# Patient Record
Sex: Male | Born: 1975 | Race: White | Hispanic: No | Marital: Single | Smoking: Current every day smoker
Health system: Southern US, Community
[De-identification: ages and names within clinical notes are randomized; demographics above are authoritative.]

## PROBLEM LIST (undated history)

## (undated) DIAGNOSIS — I319 Disease of pericardium, unspecified: Secondary | ICD-10-CM

---

## 2005-07-11 ENCOUNTER — Emergency Department (HOSPITAL_COMMUNITY): Admission: EM | Admit: 2005-07-11 | Discharge: 2005-07-11 | Payer: Self-pay | Admitting: Emergency Medicine

## 2005-07-23 ENCOUNTER — Emergency Department (HOSPITAL_COMMUNITY): Admission: EM | Admit: 2005-07-23 | Discharge: 2005-07-23 | Payer: Self-pay | Admitting: Emergency Medicine

## 2006-01-13 ENCOUNTER — Emergency Department (HOSPITAL_COMMUNITY): Admission: EM | Admit: 2006-01-13 | Discharge: 2006-01-14 | Payer: Self-pay | Admitting: Emergency Medicine

## 2006-01-18 ENCOUNTER — Emergency Department (HOSPITAL_COMMUNITY): Admission: EM | Admit: 2006-01-18 | Discharge: 2006-01-18 | Payer: Self-pay | Admitting: Emergency Medicine

## 2006-01-27 ENCOUNTER — Inpatient Hospital Stay (HOSPITAL_COMMUNITY): Admission: EM | Admit: 2006-01-27 | Discharge: 2006-02-03 | Payer: Self-pay | Admitting: Emergency Medicine

## 2006-02-19 ENCOUNTER — Ambulatory Visit (HOSPITAL_COMMUNITY): Admission: RE | Admit: 2006-02-19 | Discharge: 2006-02-19 | Payer: Self-pay | Admitting: Anesthesiology

## 2006-02-21 ENCOUNTER — Encounter: Admission: RE | Admit: 2006-02-21 | Discharge: 2006-02-21 | Payer: Self-pay | Admitting: Anesthesiology

## 2006-03-21 ENCOUNTER — Ambulatory Visit (HOSPITAL_COMMUNITY): Admission: RE | Admit: 2006-03-21 | Discharge: 2006-03-21 | Payer: Self-pay | Admitting: Physician Assistant

## 2006-10-16 ENCOUNTER — Emergency Department (HOSPITAL_COMMUNITY): Admission: EM | Admit: 2006-10-16 | Discharge: 2006-10-16 | Payer: Self-pay | Admitting: Emergency Medicine

## 2007-09-08 ENCOUNTER — Emergency Department (HOSPITAL_COMMUNITY): Admission: EM | Admit: 2007-09-08 | Discharge: 2007-09-08 | Payer: Self-pay | Admitting: Radiation Oncology

## 2008-12-05 ENCOUNTER — Emergency Department (HOSPITAL_COMMUNITY): Admission: EM | Admit: 2008-12-05 | Discharge: 2008-12-06 | Payer: Self-pay | Admitting: Emergency Medicine

## 2008-12-15 ENCOUNTER — Emergency Department (HOSPITAL_COMMUNITY): Admission: EM | Admit: 2008-12-15 | Discharge: 2008-12-16 | Payer: Self-pay | Admitting: Emergency Medicine

## 2009-05-03 ENCOUNTER — Emergency Department (HOSPITAL_COMMUNITY): Admission: EM | Admit: 2009-05-03 | Discharge: 2009-05-03 | Payer: Self-pay | Admitting: Emergency Medicine

## 2009-06-29 ENCOUNTER — Emergency Department (HOSPITAL_COMMUNITY): Admission: EM | Admit: 2009-06-29 | Discharge: 2009-06-29 | Payer: Self-pay | Admitting: Emergency Medicine

## 2009-08-24 ENCOUNTER — Inpatient Hospital Stay (HOSPITAL_COMMUNITY): Admission: EM | Admit: 2009-08-24 | Discharge: 2009-08-29 | Payer: Self-pay | Admitting: Emergency Medicine

## 2009-08-25 ENCOUNTER — Encounter (INDEPENDENT_AMBULATORY_CARE_PROVIDER_SITE_OTHER): Payer: Self-pay | Admitting: Diagnostic Neuroimaging

## 2009-08-25 ENCOUNTER — Ambulatory Visit: Payer: Self-pay | Admitting: Surgery

## 2009-08-25 ENCOUNTER — Encounter (INDEPENDENT_AMBULATORY_CARE_PROVIDER_SITE_OTHER): Payer: Self-pay | Admitting: Internal Medicine

## 2009-08-26 ENCOUNTER — Encounter: Payer: Self-pay | Admitting: Internal Medicine

## 2009-09-15 ENCOUNTER — Ambulatory Visit: Payer: Self-pay | Admitting: Internal Medicine

## 2009-09-15 DIAGNOSIS — M545 Low back pain: Secondary | ICD-10-CM

## 2009-09-15 DIAGNOSIS — G4700 Insomnia, unspecified: Secondary | ICD-10-CM

## 2009-09-15 DIAGNOSIS — F341 Dysthymic disorder: Secondary | ICD-10-CM

## 2009-09-15 DIAGNOSIS — M25559 Pain in unspecified hip: Secondary | ICD-10-CM

## 2009-09-20 ENCOUNTER — Telehealth (INDEPENDENT_AMBULATORY_CARE_PROVIDER_SITE_OTHER): Payer: Self-pay | Admitting: *Deleted

## 2009-09-27 ENCOUNTER — Encounter (INDEPENDENT_AMBULATORY_CARE_PROVIDER_SITE_OTHER): Payer: Self-pay | Admitting: Internal Medicine

## 2009-09-29 ENCOUNTER — Encounter (INDEPENDENT_AMBULATORY_CARE_PROVIDER_SITE_OTHER): Payer: Self-pay | Admitting: Internal Medicine

## 2009-10-03 ENCOUNTER — Encounter (INDEPENDENT_AMBULATORY_CARE_PROVIDER_SITE_OTHER): Payer: Self-pay | Admitting: Internal Medicine

## 2009-10-20 ENCOUNTER — Telehealth (INDEPENDENT_AMBULATORY_CARE_PROVIDER_SITE_OTHER): Payer: Self-pay | Admitting: Internal Medicine

## 2009-10-20 ENCOUNTER — Encounter (INDEPENDENT_AMBULATORY_CARE_PROVIDER_SITE_OTHER): Payer: Self-pay | Admitting: Internal Medicine

## 2009-12-01 ENCOUNTER — Emergency Department (HOSPITAL_COMMUNITY): Admission: EM | Admit: 2009-12-01 | Discharge: 2009-12-01 | Payer: Self-pay | Admitting: Emergency Medicine

## 2009-12-13 ENCOUNTER — Telehealth (INDEPENDENT_AMBULATORY_CARE_PROVIDER_SITE_OTHER): Payer: Self-pay | Admitting: Internal Medicine

## 2009-12-13 DIAGNOSIS — R269 Unspecified abnormalities of gait and mobility: Secondary | ICD-10-CM

## 2009-12-14 ENCOUNTER — Encounter (INDEPENDENT_AMBULATORY_CARE_PROVIDER_SITE_OTHER): Payer: Self-pay | Admitting: *Deleted

## 2009-12-15 ENCOUNTER — Emergency Department (HOSPITAL_COMMUNITY)
Admission: EM | Admit: 2009-12-15 | Discharge: 2009-12-15 | Payer: Self-pay | Source: Home / Self Care | Admitting: Emergency Medicine

## 2009-12-21 ENCOUNTER — Encounter (INDEPENDENT_AMBULATORY_CARE_PROVIDER_SITE_OTHER): Payer: Self-pay | Admitting: *Deleted

## 2009-12-22 ENCOUNTER — Emergency Department (HOSPITAL_COMMUNITY): Admission: EM | Admit: 2009-12-22 | Discharge: 2009-09-15 | Payer: Self-pay | Admitting: Emergency Medicine

## 2009-12-27 ENCOUNTER — Encounter: Admission: RE | Admit: 2009-12-27 | Payer: Self-pay | Source: Home / Self Care | Admitting: Internal Medicine

## 2009-12-27 ENCOUNTER — Telehealth (INDEPENDENT_AMBULATORY_CARE_PROVIDER_SITE_OTHER): Payer: Self-pay | Admitting: *Deleted

## 2010-01-24 ENCOUNTER — Ambulatory Visit: Admit: 2010-01-24 | Payer: Self-pay | Admitting: Internal Medicine

## 2010-02-14 NOTE — Letter (Signed)
Summary: *Referral Letter  [facility]  [address] [place], [place] [address]   Phone: [phone]  Fax: [phone]    10/20/2009  Thank you in advance for agreeing to see my patient:  Kurt Mcdaniel [address] [place], [place]  [address]  Phone: [phone]  Reason for Referral: 35 yo male who has just established with our clinic hospitalized beginning of August for right sided weakness and right neck and facial numbness.   Non contrast and contrast MRI show white matter disease advanced for age.  CSF labs after discharge returned with positive oligoclonal bands, ESR, RPR, HIV, CSF viral cultures, Lymes testing all negative. Positive for lupus anticoagulant with no personal or family hx of clotting problems, DVT, recurrent miscarriage.  Low protein C as well.  TSH was somewhat low at 0.236 and A1C high normal at 5.9%.  Pt. placed on prednisone burst and taper with improvement.  Did follow up with Neurology locally, a Dr. Marjory Lies, but is unable to afford payments for his office as is self pay.  Reportedly told to continue a baby aspirin and finish prednisone taper at his outpatient follow up--await those records.  Procedures Requested: Evaluation and treatment for possible MS.  Will send labs and radiologic studies from his hospitalization along with this note.  Current Medical Problems: 1)  ? of MULTIPLE SCLEROSIS (ICD-340) 2)  INSOMNIA, CHRONIC (ICD-307.42) 3)  HIP PAIN, RIGHT, CHRONIC (ICD-719.45) 4)  CHRONIC FOOT PAIN, LEFT (ICD-729.5) 5)  LOW BACK PAIN, CHRONIC (ICD-724.2) 6)  ANXIETY DEPRESSION (ICD-300.4)   Current Medications: 1)  PREDNISONE (PAK) 5 MG TABS (PREDNISONE) UAD 2)  ZOLOFT 100 MG TABS (SERTRALINE HCL) 1 1/2 tabs by mouth daily--Dr[facility] 3)  AMITRIPTYLINE HCL 25 MG TABS (AMITRIPTYLINE HCL) 1 tab by mouth at bedtime   Past Medical History:  Prior History of Blood Transfusions:   Pertinent  Labs:    Thank you again for agreeing to see our patient; please contact us if you have any further questions or need additional information.  Sincerely,  Julieanne Manson MD

## 2010-02-14 NOTE — Letter (Signed)
Summary: *HSN Results Follow up  [facility]  [address] [place], [place] [address]   Phone: [phone]  Fax: [phone]      12/21/2009   Kurt Mcdaniel [address] [place], [place]  [address]   Dear  Mr. Kurt Mcdaniel,                            ____S.Drinkard,FNP   ____D. Gore,FNP       ____B. McPherson,MD   ____V. Rankins,MD    __X__E. Mulberry,MD    ____N. Daphine Deutscher, FNP  ____D. Reche Dixon, MD    ____K. Philipp Deputy, MD    ____Other     This letter is to inform you that your recent test(s):  _______Pap Smear    _______Lab Test     _______X-ray    _______ is within acceptable limits  _______ requires a medication change  _______ requires a follow-up lab visit  _______ requires a follow-up visit with your provider   Comments: I BEEN TRYING TO REACH YOU BUT PHONE [phone] ABOUT YOUR APPT  [facility]  . YOU HAVE AN APPT 12-27-09 @ 1:15 PM  ADDRESS [address] PH # [phone] .       _________________________________________________________ If you have any questions, please contact our office                     Sincerely,  Cheryll Dessert [facility]

## 2010-02-14 NOTE — Letter (Signed)
Summary: *HSN Results Follow up  [facility]  [address] [place], [place] [address]   Phone: [phone]  Fax: [phone]      12/14/2009   Kurt Mcdaniel [address] [place], [place]  [address]   Dear  Kurt Mcdaniel,                            ____S.Drinkard,FNP   ____D. Gore,FNP       ____B. McPherson,MD   ____V. Rankins,MD    __X__E. Mulberry,MD    ____N. Daphine Deutscher, FNP  ____D. Reche Dixon, MD    ____K. Philipp Deputy, MD    ____Other     This letter is to inform you that your recent test(s):  _______Pap Smear    _______Lab Test     _______X-ray    _______ is within acceptable limits  _______ requires a medication change  _______ requires a follow-up lab visit  _______ requires a follow-up visit with your [name]   Comments: We have been trying to reach you by phone is about your referral to the Neurologist . Please, call [phone] to schedule an appt  they don't  give it to me . After you have the appt can you please,call me and letme know Thank you .       _________________________________________________________ If you have any questions, please contact our office                     Sincerely,  Cheryll Dessert [facility]

## 2010-02-14 NOTE — Letter (Signed)
Summary: REQUESTING RECORDS FROM [facility]  REQUESTING RECORDS FROM [facility]   Imported By: Arta Bruce 09/28/2009 14:34:37  _____________________________________________________________________  External Attachment:    Type:   Image     Comment:   External Document

## 2010-02-14 NOTE — Letter (Signed)
Summary: REQUESTING RECORDS FROM [facility]  REQUESTING RECORDS FROM [facility]   Imported By: Arta Bruce 09/28/2009 14:23:25  _____________________________________________________________________  External Attachment:    Type:   Image     Comment:   External Document

## 2010-02-14 NOTE — Letter (Signed)
Summary: PT INFORMATION SHEET  PT INFORMATION SHEET   Imported By: Arta Bruce 09/16/2009 14:48:39  _____________________________________________________________________  External Attachment:    Type:   Image     Comment:   External Document
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
## 2010-02-14 NOTE — Progress Notes (Signed)
Summary: Neurology referral  Phone Note Outgoing Call   Summary of Call: NOra--needs referral to [facility] neuro for ?MS. Will need copies of all labs and radiologic testing --especially the 2 MRIs  done in hospital in August/ H and P, Discharge summary.  If you need me to copy those off and bring to you, let me know.  He will need to go to hospital and get a disc with the radiology studies on it to take to his appt. Initial call taken by: Julieanne Manson MD,  October 20, 2009 12:42 PM  Follow-up for Phone Call        Dr Delrae Alfred I went to [facility] but don't want to print . Can you printing for me please Thank you   Follow-up by: Cheryll Dessert,  October 28, 2009 5:10 PM  Additional Follow-up for Phone Call Additional follow up Details #1::        I printed already.I send the Referral to [facility]  Neurosurgeon waiting for an appt  Additional Follow-up by: Cheryll Dessert,  November 03, 2009 12:55 PM
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
## 2010-02-14 NOTE — Letter (Signed)
Summary: REQUESTING RECORDS FROM  [facility]  [facility]  REQUESTING RECORDS FROM  [facility]  [facility]   Imported By: Arta Bruce 09/28/2009 14:27:16  _____________________________________________________________________  External Attachment:    Type:   Image     Comment:   External Document

## 2010-02-16 NOTE — Progress Notes (Signed)
Summary: OCCUPATIONAL ORDER NEEDS TO BE CHANGED  Phone Note From Other Clinic   Caller: ANGIE @ [facility] Reason for Call: Need Referral Information Summary of Call: MULBERRY PT. ANGIE FROM [facility]. CALLED AND SAYS THAT Leng IS COMING IN AFTER LUNCH TODAY FOR THERAPY AND HIS ORDER NEEDS TO BE CHANGED FROM OCCUPATIONAL THERAPY TO PHYSICAL THERAPY. THE FAX NUMBER IS [phone] Initial call taken by: Leodis Rains,  December 27, 2009 10:33 AM  Follow-up for Phone Call        Will foward to Dr. Delrae Alfred.  Follow-up by: Hale Drone CMA,  December 27, 2009 2:58 PM  Additional Follow-up for Phone Call Additional follow up Details #1::        The order is for PT, not OT--do they actually want OT? Additional Follow-up by: Julieanne Manson MD,  December 28, 2009 11:25 AM    Additional Follow-up for Phone Call Additional follow up Details #2::    Called [facility]... They indeed have an order that read OT instead of PT. Dr. Delrae Alfred signed the new order that reads PT. Also, pt. missed appt. Called and LM on [phone] pt. to call us back. Need to r/s appt. Will foward to Arna Medici to f/u on. ..... Hale Drone CMA  December 30, 2009 11:42 AM

## 2010-03-11 ENCOUNTER — Encounter (INDEPENDENT_AMBULATORY_CARE_PROVIDER_SITE_OTHER): Payer: Self-pay | Admitting: Internal Medicine

## 2010-03-14 NOTE — Miscellaneous (Signed)
  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 Marjory Lies felt presence of lupus anticoagulant, sudden onset of symptoms and lack of contrast enhancement suggest antiphospholipid antibody syndrome rather than  MS

## 2010-03-28 NOTE — Letter (Signed)
Summary: RECEIVED RECORDS FROM [facility]  RECEIVED RECORDS FROM [facility]   Imported By: Arta Bruce 03/21/2010 09:33:31  _____________________________________________________________________  External Attachment:    Type:   Image     Comment:   External Document

## 2010-03-28 NOTE — Letter (Signed)
Summary: [facility]   [facility]   Imported By: Arta Bruce 03/21/2010 09:36:19  _____________________________________________________________________  External Attachment:    Type:   Image     Comment:   External Document

## 2010-03-31 LAB — CBC
HCT: 35.9 % — ABNORMAL LOW (ref 39.0–52.0)
HCT: 36 % — ABNORMAL LOW (ref 39.0–52.0)
HCT: 37.4 % — ABNORMAL LOW (ref 39.0–52.0)
Hemoglobin: 12.2 g/dL — ABNORMAL LOW (ref 13.0–17.0)
Hemoglobin: 12.2 g/dL — ABNORMAL LOW (ref 13.0–17.0)
MCH: 31.4 pg (ref 26.0–34.0)
MCHC: 33.9 g/dL (ref 30.0–36.0)
MCHC: 34.1 g/dL (ref 30.0–36.0)
MCV: 93.6 fL (ref 78.0–100.0)
Platelets: 314 10*3/uL (ref 150–400)
RBC: 3.86 MIL/uL — ABNORMAL LOW (ref 4.22–5.81)
RBC: 3.99 MIL/uL — ABNORMAL LOW (ref 4.22–5.81)
RDW: 12.9 % (ref 11.5–15.5)
RDW: 12.9 % (ref 11.5–15.5)

## 2010-03-31 LAB — CSF CELL COUNT WITH DIFFERENTIAL
Tube #: 1
Tube #: 4
WBC, CSF: 6 /mm3 — ABNORMAL HIGH (ref 0–5)

## 2010-03-31 LAB — COMPREHENSIVE METABOLIC PANEL
ALT: 31 U/L (ref 0–53)
AST: 64 U/L — ABNORMAL HIGH (ref 0–37)
Albumin: 3.1 g/dL — ABNORMAL LOW (ref 3.5–5.2)
Alkaline Phosphatase: 71 U/L (ref 39–117)
BUN: 18 mg/dL (ref 6–23)
CO2: 23 mEq/L (ref 19–32)
Calcium: 8 mg/dL — ABNORMAL LOW (ref 8.4–10.5)
Calcium: 8.2 mg/dL — ABNORMAL LOW (ref 8.4–10.5)
Chloride: 105 mEq/L (ref 96–112)
Chloride: 105 mEq/L (ref 96–112)
GFR calc Af Amer: >60 mL/min (ref >60)
GFR calc non Af Amer: >60 mL/min (ref >60)
GFR calc non Af Amer: >60 mL/min (ref >60)
Glucose, Bld: 245 mg/dL — ABNORMAL HIGH (ref 70–99)
Glucose, Bld: 92 mg/dL (ref 70–99)
Potassium: 3.7 mEq/L (ref 3.5–5.1)
Potassium: 4.4 mEq/L (ref 3.5–5.1)
Sodium: 136 mEq/L (ref 135–145)
Total Bilirubin: 0.2 mg/dL — ABNORMAL LOW (ref 0.3–1.2)
Total Bilirubin: 0.5 mg/dL (ref 0.3–1.2)
Total Protein: 5.6 g/dL — ABNORMAL LOW (ref 6.0–8.3)

## 2010-03-31 LAB — URINALYSIS, ROUTINE W REFLEX MICROSCOPIC
Glucose, UA: NEGATIVE mg/dL
Nitrite: NEGATIVE

## 2010-03-31 LAB — POCT I-STAT, CHEM 8
Chloride: 95 mEq/L — ABNORMAL LOW (ref 96–112)
Glucose, Bld: 92 mg/dL (ref 70–99)
Hemoglobin: 14.6 g/dL (ref 13.0–17.0)
Potassium: 4.2 mEq/L (ref 3.5–5.1)
Sodium: 129 mEq/L — ABNORMAL LOW (ref 135–145)

## 2010-03-31 LAB — BASIC METABOLIC PANEL
BUN: 15 mg/dL (ref 6–23)
CO2: 29 mEq/L (ref 19–32)
Glucose, Bld: 137 mg/dL — ABNORMAL HIGH (ref 70–99)
Potassium: 3.8 mEq/L (ref 3.5–5.1)
Sodium: 138 mEq/L (ref 135–145)

## 2010-03-31 LAB — SEDIMENTATION RATE: Sed Rate: 15 mm/hr (ref 0–16)

## 2010-03-31 LAB — FOLATE RBC: RBC Folate: 702 ng/mL — ABNORMAL HIGH (ref 180–600)

## 2010-03-31 LAB — PROTIME-INR
INR: 1.44 (ref 0.00–1.49)
Prothrombin Time: 17.7 seconds — ABNORMAL HIGH (ref 11.6–15.2)

## 2010-03-31 LAB — ANTITHROMBIN III: AntiThromb III Func: 94 % (ref 76–126)

## 2010-03-31 LAB — FUNGUS CULTURE W SMEAR

## 2010-03-31 LAB — CARDIOLIPIN ANTIBODIES, IGG, IGM, IGA: Anticardiolipin IgM: 9 MPL U/mL — ABNORMAL LOW (ref <11)

## 2010-03-31 LAB — GLUCOSE, CAPILLARY
Glucose-Capillary: 137 mg/dL — ABNORMAL HIGH (ref 70–99)
Glucose-Capillary: 141 mg/dL — ABNORMAL HIGH (ref 70–99)
Glucose-Capillary: 174 mg/dL — ABNORMAL HIGH (ref 70–99)
Glucose-Capillary: 97 mg/dL (ref 70–99)

## 2010-03-31 LAB — PROTEIN S ACTIVITY: Protein S Activity: 78 % (ref 69–129)

## 2010-03-31 LAB — HOMOCYSTEINE: Homocysteine: 5.8 umol/L (ref 4.0–15.4)

## 2010-03-31 LAB — MAGNESIUM: Magnesium: 2 mg/dL (ref 1.5–2.5)

## 2010-03-31 LAB — LUPUS ANTICOAGULANT PANEL
DRVVT: 47.7 secs — ABNORMAL HIGH (ref 36.2–44.3)
Lupus Anticoagulant: DETECTED — AB
PTTLA 4:1 Mix: 50.2 secs — ABNORMAL HIGH (ref 30.0–45.6)
dRVVT Incubated 1:1 Mix: 42.7 secs (ref 36.2–44.3)

## 2010-03-31 LAB — CSF CULTURE W GRAM STAIN

## 2010-03-31 LAB — APTT: aPTT: 31 seconds (ref 24–37)

## 2010-03-31 LAB — VIRUS CULTURE: Preliminary Culture: NEGATIVE

## 2010-03-31 LAB — RAPID URINE DRUG SCREEN, HOSP PERFORMED
Amphetamines: NOT DETECTED
Barbiturates: NOT DETECTED

## 2010-03-31 LAB — PROTEIN AND GLUCOSE, CSF
Glucose, CSF: 88 mg/dL — ABNORMAL HIGH (ref 43–76)
Total  Protein, CSF: 50 mg/dL — ABNORMAL HIGH (ref 15–45)

## 2010-03-31 LAB — RPR: RPR Ser Ql: NONREACTIVE

## 2010-03-31 LAB — HEMOGLOBIN A1C: Mean Plasma Glucose: 123 mg/dL — ABNORMAL HIGH (ref <117)

## 2010-03-31 LAB — ANGIOTENSIN CONVERTING ENZYME, CSF: Angio Convert Enzyme: <3 U/L (ref <16)

## 2010-03-31 LAB — BETA-2-GLYCOPROTEIN I ABS, IGG/M/A
Beta-2-Glycoprotein I IgA: 2 A Units (ref <20)
Beta-2-Glycoprotein I IgM: 7 M Units (ref <20)

## 2010-03-31 LAB — ETHANOL: Alcohol, Ethyl (B): <5 mg/dL (ref 0–10)

## 2010-03-31 LAB — TSH: TSH: 0.236 u[IU]/mL — ABNORMAL LOW (ref 0.350–4.500)

## 2010-04-02 LAB — DIFFERENTIAL
Basophils Absolute: 0 10*3/uL (ref 0.0–0.1)
Eosinophils Absolute: 0.1 10*3/uL (ref 0.0–0.7)
Eosinophils Relative: 1 % (ref 0–5)
Lymphocytes Relative: 23 % (ref 12–46)

## 2010-04-02 LAB — CBC
HCT: 44.2 % (ref 39.0–52.0)
Platelets: 200 10*3/uL (ref 150–400)
RDW: 12.7 % (ref 11.5–15.5)

## 2010-04-02 LAB — POCT I-STAT, CHEM 8
Calcium, Ion: 1.08 mmol/L — ABNORMAL LOW (ref 1.12–1.32)
Chloride: 104 mEq/L (ref 96–112)
HCT: 48 % (ref 39.0–52.0)
Hemoglobin: 16.3 g/dL (ref 13.0–17.0)
TCO2: 28 mmol/L (ref 0–100)

## 2010-04-02 LAB — LIPASE, BLOOD: Lipase: 23 U/L (ref 11–59)

## 2010-04-04 LAB — URINALYSIS, ROUTINE W REFLEX MICROSCOPIC
Bilirubin Urine: NEGATIVE
Nitrite: NEGATIVE
Specific Gravity, Urine: 1.019 (ref 1.005–1.030)
pH: 5.5 (ref 5.0–8.0)

## 2010-04-04 LAB — HEMOCCULT GUIAC POC 1CARD (OFFICE): Fecal Occult Bld: NEGATIVE

## 2010-04-04 LAB — COMPREHENSIVE METABOLIC PANEL
AST: 15 U/L (ref 0–37)
Albumin: 4.1 g/dL (ref 3.5–5.2)
Alkaline Phosphatase: 44 U/L (ref 39–117)
Chloride: 106 mEq/L (ref 96–112)
GFR calc Af Amer: >60 mL/min (ref >60)
Potassium: 4 mEq/L (ref 3.5–5.1)
Total Bilirubin: 0.4 mg/dL (ref 0.3–1.2)

## 2010-04-04 LAB — DIFFERENTIAL
Basophils Absolute: 0 10*3/uL (ref 0.0–0.1)
Basophils Relative: 0 % (ref 0–1)
Eosinophils Relative: 1 % (ref 0–5)
Lymphocytes Relative: 31 % (ref 12–46)
Monocytes Absolute: 0.8 10*3/uL (ref 0.1–1.0)
Monocytes Relative: 8 % (ref 3–12)

## 2010-04-04 LAB — CBC
Platelets: 233 10*3/uL (ref 150–400)
WBC: 10 10*3/uL (ref 4.0–10.5)

## 2010-04-04 LAB — URINE CULTURE: Colony Count: NO GROWTH

## 2010-04-04 LAB — APTT: aPTT: 34 seconds (ref 24–37)
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
## 2010-06-02 NOTE — H&P (Signed)
NAME:  Kurt Mcdaniel, Kurt Mcdaniel             ACCOUNT NO.:  [id number]   MEDICAL RECORD NO.:  [id number]          PATIENT TYPE:  INP   LOCATION:  5024                         FACILITY:  [facility]   PHYSICIAN:  Hillery Aldo, M.D.   DATE OF BIRTH:  [birth year]   DATE OF ADMISSION:  01/27/2006  DATE OF DISCHARGE:                              HISTORY & PHYSICAL   PRIMARY CARE PHYSICIAN:  Production assistant, radio.   CHIEF COMPLAINT:  Back pain.   HISTORY OF PRESENT ILLNESS:  The patient is a 35 year old male with a  past medical history of herniated disk related to a work injury that he  suffered back in the fall.  The patient claims that while in the shower  today, he became faint and slipped, striking his back and buttocks and  now has intractable lower back pain radiating down the right leg.  The  patient has had intermittent back pain secondary to his work injury  prior to this exacerbation.  The patient has received multiple doses of  Dilaudid and is somewhat sedated, and, therefore, the history is  somewhat unreliable.  He tells differing stories about whether or not he  was faint prior to the fall.  Because of ongoing pain, he insisted on  being admitted to the hospital, and, therefore, we were consulted for  admission services.  Preliminary radiographs are negative.  He is being  admitted for observation and pain control.   PAST MEDICAL HISTORY:  None except for herniated disk secondary to work  injury.   FAMILY HISTORY:  Reviewed and is noncontributory.   SOCIAL HISTORY:  The patient is single and lives with his girlfriend.  He smokes one-half to one pack of tobacco daily.  He denies alcohol or  drug use.  He works as a Production designer, theatre/television/film for [employer].   ALLERGIES:  None.   MEDICATIONS:  None.   REVIEW OF SYSTEMS:  The patient denies fever.  He reports occasional  chills.  He states that his appetite is normal except when he is having  back pain and then it is decreased.  Denies  chest pain.  He is  occasionally short of breath.  Denies cough.  He does report pain with  bowel movements but no melena or hematochezia.  Denies dysuria.  Back  pain as described in HPI.   PHYSICAL EXAMINATION:  VITAL SIGNS:  Temperature 96.8, pulse 78,  respirations 16, blood pressure 140/79.  GENERAL:  This is a sedated male who is somewhat garrulous and  argumentative, wanting to leave the emergency department area to go  smoke a cigarette.  He keeps attempting to get out of bed despite being  asked not to do so.  HEENT:  Normocephalic, atraumatic.  Pupils are equal, round and reactive  to light.  Extraocular movements intact.  Oropharynx is clear.  NECK:  Supple, no thyromegaly, no lymphadenopathy, no jugular venous  distension.  CHEST:  Lungs clear to auscultation bilaterally with good air movement.  HEART:  Regular rate and rhythm.  No murmurs, rubs or gallops.  ABDOMEN:  Soft, nontender, nondistended with normoactive  bowel sounds.  EXTREMITIES:  No clubbing, edema or cyanosis.  SKIN:  Warm and dry.  No rashes.  NEUROLOGIC:  The patient is alert and oriented x3.  Cranial nerves II-  XII are grossly intact.  He has diminished strength to his lower  extremities bilaterally which is symmetric.  He has positive leg raises.  His pain seems out of proportion to physical exam findings.   ASSESSMENT/PLAN:  1. Intractable back pain.  The patient likely has some underlying      degenerative disk disease and herniated disks in the lower spine      accounting for his back pain.  Because of inability to control his      pain with IV narcotics, we are admitting him to evaluate his lower      back with an MRI scan.  He will be put on PCA Dilaudid at full      strength and Toradol for pain control.  If his MRI does show      significant cord compression, will consult neurosurgery and put the      patient on steroids.  Will go ahead and put him on stool softeners,      given the fact that he  will be on narcotic-based pain medicine to      help prevent constipation.  2. Tobacco abuse.  Will initiate tobacco cessation counseling and      prescribe a nicotine patch.  3. Prophylaxis.  Will initiate GI prophylaxis with Protonix and deep      vein thrombosis prophylaxis with Lovenox.      Hillery Aldo, M.D.  Electronically Signed     CR/MEDQ  D:  01/27/2006  T:  01/28/2006  Job:  [id number]

## 2010-06-02 NOTE — Discharge Summary (Signed)
Kurt Mcdaniel, Kurt Mcdaniel             ACCOUNT NO.:  [id number]   MEDICAL RECORD NO.:  [id number]          PATIENT TYPE:  INP   LOCATION:  5024                         FACILITY:  [facility]   PHYSICIAN:  Lonia Blood, M.D.      DATE OF BIRTH:  Mar 21, 1975   DATE OF ADMISSION:  01/27/2006  DATE OF DISCHARGE:  02/03/2006                               DISCHARGE SUMMARY   PRIMARY CARE PHYSICIAN:  [facility].   DISCHARGE DIAGNOSES:  1. Intractable back pain with some mild degenerative disk disease,      nonsurgical per neurosurgeon and orthopedic surgeon.  2. Right upper lobe pneumonia.  3. Tobacco abuse.   DISCHARGE MEDICATIONS:  1. OxyContin 30 mg p.o. b.i.d.  2. Oxycodone immediate release 10 mg q.4h. p.r.n..  3. Avelox 400 mg daily for 5 days.  4. Xanax 1 mg 3x a day for anxiety.  5. Flexeril 10 mg q.6h.  6. Lyrica 75 mg b.i.d.  7. Colace 100 mg b.i.d.   DISPOSITION:  Patient will follow up with Dr. Vear Clock at pain clinic.  He has intractable back pain, non-surgical in nature.  He will also  follow up with [facility], at which point they will  clear him to go to work as necessary.   PROCEDURES PERFORMED:  X-ray of the C spine on January 13 showed no  acute findings.  Plain x-ray of the L spine also showed no acute  findings, mild left wall scoliosis.  MRI of the L spine on January 13  showed diffuse annular bulge and annular rent L4-5 but no significant  neural compression, mild foraminal encroachment bilaterally.  Shallow  central disk protrusion at L5-S1.  Chest x-ray on January 18 shows right  upper lobe pneumonia, mild chronic bronchitic changes.   CONSULTATIONS:  1. Hilda Lias, Neurosurgery.  2. Dr. Shelle Iron, Orthopedic Surgery.   BRIEF HISTORY AND PHYSICAL:  Please refer to dictated History and  Physical by Dr. Hillery Aldo.  In short, however, this is a 35-year-  old gentleman who has a history of work-related injury after he fell.  He  has some mild disk protrusion and has been out of work apparently  since October.  He came in with severe back pain that is intractable and  not being managed by his home medications.  He was subsequently admitted  for further management.   HOSPITAL COURSE:  1. Intractable back pain.  Patient was admitted, had an MRI done that      showed mild findings as above.  Neurosurgery was consulted, Dr.      Jeral Fruit reviewed the MRI and informed patient that it is not      something that requires immediate surgery.  Patient was, however,      adamant.  I have discussed also with Dr. Shelle Iron who apparently had      an outpatient MRI and told the patient the same thing.  Patient was      sent to have an epidural steroid injection.  Patient was adamant.      He was on PCA pump for a while, still  not getting control of his      pain.  With gradually transition him to orals, patient still      complains of pain.  As such, we determined that the best option is      to send you to a pain clinic center where they can try various      options to control his pain.  2. Tobacco use.  Patient was started on nicotine patch in the      hospital.  3. Pneumonia.  On January 31, 2006, patient spiked a fever of 102.  He      has been bed-bound for a long time and has been on large doses of      narcotics.  This may have precipitated his current pneumonia.  With      that, we have started him on Avelox and will continue that for      another 5 more days.  Otherwise, the patient is stable and will      follow up with Dr. Vear Clock in the pain clinic, and the telephone      number has been given to the patient.      Lonia Blood, M.D.  Electronically Signed     LG/MEDQ  D:  02/03/2006  T:  02/03/2006  Job:  [id number]

## 2012-04-08 ENCOUNTER — Emergency Department (HOSPITAL_COMMUNITY)
Admission: EM | Admit: 2012-04-08 | Discharge: 2012-04-08 | Disposition: A | Discharge status: Home / Self Care | Payer: Self-pay | Attending: Emergency Medicine | Admitting: Emergency Medicine

## 2012-04-08 ENCOUNTER — Encounter (HOSPITAL_COMMUNITY): Payer: Self-pay | Admitting: Emergency Medicine

## 2012-04-08 ENCOUNTER — Emergency Department (HOSPITAL_COMMUNITY): Payer: Self-pay

## 2012-04-08 DIAGNOSIS — K920 Hematemesis: Secondary | ICD-10-CM | POA: Insufficient documentation

## 2012-04-08 DIAGNOSIS — R197 Diarrhea, unspecified: Secondary | ICD-10-CM | POA: Insufficient documentation

## 2012-04-08 DIAGNOSIS — R079 Chest pain, unspecified: Secondary | ICD-10-CM

## 2012-04-08 DIAGNOSIS — R109 Unspecified abdominal pain: Secondary | ICD-10-CM

## 2012-04-08 DIAGNOSIS — R0789 Other chest pain: Secondary | ICD-10-CM | POA: Insufficient documentation

## 2012-04-08 DIAGNOSIS — R112 Nausea with vomiting, unspecified: Secondary | ICD-10-CM | POA: Insufficient documentation

## 2012-04-08 DIAGNOSIS — R141 Gas pain: Secondary | ICD-10-CM | POA: Insufficient documentation

## 2012-04-08 DIAGNOSIS — Z79899 Other long term (current) drug therapy: Secondary | ICD-10-CM | POA: Insufficient documentation

## 2012-04-08 DIAGNOSIS — R1084 Generalized abdominal pain: Secondary | ICD-10-CM | POA: Insufficient documentation

## 2012-04-08 DIAGNOSIS — R142 Eructation: Secondary | ICD-10-CM | POA: Insufficient documentation

## 2012-04-08 DIAGNOSIS — F17200 Nicotine dependence, unspecified, uncomplicated: Secondary | ICD-10-CM | POA: Insufficient documentation

## 2012-04-08 LAB — CBC WITH DIFFERENTIAL/PLATELET
Basophils Relative: 0 % (ref 0–1)
Eosinophils Absolute: 0.3 10*3/uL (ref 0.0–0.7)
HCT: 43 % (ref 39.0–52.0)
Hemoglobin: 15.1 g/dL (ref 13.0–17.0)
Lymphs Abs: 3.3 10*3/uL (ref 0.7–4.0)
MCH: 30.5 pg (ref 26.0–34.0)
MCHC: 35.1 g/dL (ref 30.0–36.0)
Monocytes Absolute: 0.6 10*3/uL (ref 0.1–1.0)
Monocytes Relative: 7 % (ref 3–12)
RBC: 4.95 MIL/uL (ref 4.22–5.81)

## 2012-04-08 LAB — POCT I-STAT TROPONIN I: Troponin i, poc: 0 ng/mL (ref 0.00–0.08)

## 2012-04-08 LAB — COMPREHENSIVE METABOLIC PANEL
AST: 17 U/L (ref 0–37)
CO2: 27 mEq/L (ref 19–32)
Calcium: 8.7 mg/dL (ref 8.4–10.5)
Creatinine, Ser: 1.11 mg/dL (ref 0.50–1.35)
GFR calc non Af Amer: 85 mL/min — ABNORMAL LOW (ref >90)
Total Protein: 6.7 g/dL (ref 6.0–8.3)

## 2012-04-08 LAB — PROTIME-INR
INR: 0.93 (ref 0.00–1.49)
Prothrombin Time: 12.4 seconds (ref 11.6–15.2)

## 2012-04-08 MED ORDER — ONDANSETRON HCL 4 MG/2ML IJ SOLN
4.0000 mg | Freq: Once | INTRAMUSCULAR | Status: AC
  Administered 2012-04-08: 4 mg via INTRAVENOUS
  Filled 2012-04-08: qty 2

## 2012-04-08 MED ORDER — MORPHINE SULFATE 4 MG/ML IJ SOLN
4.0000 mg | Freq: Once | INTRAMUSCULAR | Status: AC
  Administered 2012-04-08: 4 mg via INTRAVENOUS
  Filled 2012-04-08: qty 1

## 2012-04-08 MED ORDER — GI COCKTAIL ~~LOC~~
30.0000 mL | Freq: Once | ORAL | Status: AC
  Administered 2012-04-08: 30 mL via ORAL
  Filled 2012-04-08: qty 30

## 2012-04-08 MED ORDER — HYDROCODONE-ACETAMINOPHEN 5-325 MG PO TABS
1.0000 | ORAL_TABLET | Freq: Once | ORAL | Status: AC
  Administered 2012-04-08: 1 via ORAL
  Filled 2012-04-08: qty 1

## 2012-04-08 MED ORDER — IBUPROFEN 800 MG PO TABS: 800.0000 mg | ORAL_TABLET | Freq: Three times a day (TID) | ORAL | Status: DC

## 2012-04-08 MED ORDER — PANTOPRAZOLE SODIUM 40 MG PO TBEC: 40.0000 mg | DELAYED_RELEASE_TABLET | Freq: Every day | ORAL | Status: DC

## 2012-04-08 MED ORDER — SODIUM CHLORIDE 0.9 % IV BOLUS (SEPSIS)
1000.0000 mL | Freq: Once | INTRAVENOUS | Status: AC
  Administered 2012-04-08: 1000 mL via INTRAVENOUS

## 2012-04-08 MED ORDER — IBUPROFEN 800 MG PO TABS
800.0000 mg | ORAL_TABLET | Freq: Once | ORAL | Status: AC
  Administered 2012-04-08: 800 mg via ORAL
  Filled 2012-04-08: qty 1

## 2012-04-08 NOTE — ED Provider Notes (Signed)
History     CSN: [id number]  Arrival date & time 04/08/12  1453  Chief Complaint  Patient presents with  . Chest Pain  . Abdominal Pain   HPI  37 y/o male with no significant PMH who presents with cc of chest pain, vomiting and abdominal pain. The patient states his symptoms began with substernal chest pain approximately 3 days ago. He states he feels like his chest is being "squeezed in a vice". He had some associated belching and several episodes of vomiting. The patient states that he took zantac without releif. He states that his last episode of vomit included blood.  He deneis any similar symptoms in the past. He states that since his symptoms have started he has developed abdominal pain. He states his chest pain is currently a 8/10 and his abdominal pain is 5/10. He states his pain in his abdomen feels "like a hot coal is burning" in his stomach. Denies drug use.   History reviewed. No pertinent past medical history.  History reviewed. No pertinent past surgical history.  History reviewed. No pertinent family history.  History  Substance Use Topics  . Smoking status: Current Every Day Smoker  . Smokeless tobacco: Not on file  . Alcohol Use: No    Review of Systems  Constitutional: Negative for fever and chills.  Respiratory: Negative for cough and shortness of breath.   Cardiovascular: Positive for chest pain.  Gastrointestinal: Positive for vomiting, abdominal pain, diarrhea and constipation. Negative for blood in stool.   Allergies  Review of patient's allergies indicates no known allergies.  Home Medications   Current Outpatient Rx  Name  Route  Sig  Dispense  Refill  . diphenhydrAMINE (BENADRYL) 25 MG tablet   Oral   Take 50 mg by mouth every 6 (six) hours as needed for itching.         . Naproxen Sodium (ALEVE PO)   Oral   Take 1 tablet by mouth daily as needed (for knee pain.).         Marland Kitchen Ranitidine HCl (ZANTAC PO)   Oral   Take 1 tablet by mouth daily  as needed (for indigestion).         . TraZODone HCl 150 MG TB24   Oral   Take 1 tablet by mouth daily.         Marland Kitchen ibuprofen (ADVIL,MOTRIN) 800 MG tablet   Oral   Take 1 tablet (800 mg total) by mouth 3 (three) times daily.   21 tablet   0   . pantoprazole (PROTONIX) 40 MG tablet   Oral   Take 1 tablet (40 mg total) by mouth daily.   30 tablet   3     BP 109/87  Pulse 74  Temp(Src) 98 F (36.7 C) (Oral)  Resp 18  SpO2 100%  Physical Exam  Nursing note and vitals reviewed. Constitutional: He is oriented to person, place, and time. He appears well-developed and well-nourished.  HENT:  Head: Normocephalic and atraumatic.  Mouth/Throat: No oropharyngeal exudate.  Eyes: Conjunctivae and EOM are normal. Pupils are equal, round, and reactive to light.  Neck: Normal range of motion. Neck supple.  Cardiovascular: Normal rate and regular rhythm.  Exam reveals no gallop and no friction rub.   No murmur heard. Pulmonary/Chest: Effort normal and breath sounds normal.  Abdominal: Soft. He exhibits no distension. There is tenderness (mild diffuse). There is no rigidity and no guarding.  Genitourinary: Guaiac negative stool.  Musculoskeletal: Normal range  of motion. He exhibits no edema and no tenderness.  Neurological: He is oriented to person, place, and time.  Skin: Skin is warm and dry.  Psychiatric: He has a normal mood and affect.    ED Course  Procedures (including critical care time)  Labs Reviewed  COMPREHENSIVE METABOLIC PANEL - Abnormal; Notable for the following:    Total Bilirubin 0.2 (*)    GFR calc non Af Amer 85 (*)    All other components within normal limits  CBC WITH DIFFERENTIAL  LIPASE, BLOOD  PROTIME-INR  POCT I-STAT TROPONIN I  OCCULT BLOOD, POC DEVICE  POCT I-STAT TROPONIN I  SAMPLE TO BLOOD BANK   Dg Chest 2 View  04/08/2012  *RADIOLOGY REPORT*  Clinical Data: Chest pain  CHEST - 2 VIEW  Comparison: 12/01/2009  Findings: Lungs are clear. No  pleural effusion or pneumothorax.  Cardiomediastinal silhouette is within normal limits.  Visualized osseous structures are within normal limits.  IMPRESSION: No evidence of acute cardiopulmonary disease.   Original Report Authenticated By: Charline Bills, M.D.    1. Chest pain   2. Abdominal pain   3. Nausea and vomiting   4. Hematemesis     Date: 04/08/2012  Rate: 66  Rhythm: normal sinus rhythm  QRS Axis: normal  Intervals: normal  ST/T Wave abnormalities: diffuse ST elevation and PR depression  Conduction Disutrbances:none  Narrative Interpretation:   Old EKG Reviewed: none available  MDM  38 y/o male with no significant PMH who presents with cc of chest pain, vomiting and abdominal pain. Afebrile. Well appearing. Mildly tachycardic. Exam unremarkable except for mild diffuse tenderness. Labs unremarkable. Delta troponin negative. Doubt ACS given no ischemic changes on EKG, negative delta troponin, no risk factors and atypical story. CXR not c/w pneumonia, dissection, or esophageal perforation. Doubt PE. Hematemesis likely from mallory wise tear. No vomiting while in ED. The patient tolerated PO intake. Repeat abdominal exam at time of discharge was without tenderness. Given normal labs, non-tender abdominal exam feel acute intraabdominal pathology is unlikely and will not pursue additional imaging at this time. Doubt GI bleed given no hematemesis here, normal hemoglobin, and negative guaiac stool.  The patient's EKG was concerning for possible pericarditis. The patient endorses recent URI symptoms but his symptoms are not classic for pericarditis (no pain with supine position). Given no additional cause of chest pain will empirically treat for pericarditis. The patient was discharged home with a script for Ibuprofen 800 mg TID for the next 7 days as well as protonix. The patient was instructed to make an appointment with a primary care physicain in the next week for a recheck of his  hemoglobin and check up regarding chest pain. Return precautions given and discussed with the patient including any worsening of symptoms, new symptoms, worsening abdominal pain, or persistent vomiting.         Shanon Ace, MD 04/08/12 2137

## 2012-04-08 NOTE — ED Notes (Signed)
Pt c/o abd pain with N/V and pain in chest starting yesterday; pt sts vomiting with some blood today

## 2012-04-08 NOTE — ED Notes (Signed)
Pt denies any abd pain st's stomach is burning.  Pt c/o chest pain thought it was gas but has not subsided.  Pt st's he vomited lg amount of dark red blood prior to coming into ED.  St's he vomited again in triage (dark red blood).

## 2012-04-09 NOTE — ED Provider Notes (Signed)
I have personally seen and examined the patient.  I have discussed the plan of care with the resident.  I have reviewed the documentation on PMH/FH/Soc. History.  I have reviewed the documentation of the resident and agree.  I have reviewed and agree with the ECG interpretation(s) documented by the resident.  Pt well appearing, no distress.  He may have pericarditis, but appears uncomplicated.  He was monitored without any acute worsening.  His abdominal exam was benign on my evaluation No signs of acute GI bleed  Joya Gaskins, MD 04/09/12 [phone]

## 2012-04-14 DIAGNOSIS — IMO0002 Reserved for concepts with insufficient information to code with codable children: Secondary | ICD-10-CM | POA: Insufficient documentation

## 2012-04-14 DIAGNOSIS — I319 Disease of pericardium, unspecified: Secondary | ICD-10-CM | POA: Insufficient documentation

## 2012-04-14 DIAGNOSIS — F17200 Nicotine dependence, unspecified, uncomplicated: Secondary | ICD-10-CM | POA: Insufficient documentation

## 2012-04-14 DIAGNOSIS — Z79899 Other long term (current) drug therapy: Secondary | ICD-10-CM | POA: Insufficient documentation

## 2012-04-14 DIAGNOSIS — F411 Generalized anxiety disorder: Secondary | ICD-10-CM | POA: Insufficient documentation

## 2012-04-15 ENCOUNTER — Emergency Department (HOSPITAL_COMMUNITY)
Admission: EM | Admit: 2012-04-15 | Discharge: 2012-04-15 | Disposition: A | Discharge status: Home / Self Care | Payer: Self-pay | Attending: Emergency Medicine | Admitting: Emergency Medicine

## 2012-04-15 ENCOUNTER — Emergency Department (HOSPITAL_COMMUNITY): Payer: Self-pay

## 2012-04-15 ENCOUNTER — Encounter (HOSPITAL_COMMUNITY): Payer: Self-pay | Admitting: *Deleted

## 2012-04-15 DIAGNOSIS — F419 Anxiety disorder, unspecified: Secondary | ICD-10-CM

## 2012-04-15 DIAGNOSIS — I319 Disease of pericardium, unspecified: Secondary | ICD-10-CM

## 2012-04-15 LAB — COMPREHENSIVE METABOLIC PANEL
ALT: 19 U/L (ref 0–53)
AST: 16 U/L (ref 0–37)
Albumin: 4.4 g/dL (ref 3.5–5.2)
Alkaline Phosphatase: 61 U/L (ref 39–117)
BUN: 20 mg/dL (ref 6–23)
Chloride: 98 mEq/L (ref 96–112)
Potassium: 3.6 mEq/L (ref 3.5–5.1)
Sodium: 136 mEq/L (ref 135–145)
Total Bilirubin: 0.3 mg/dL (ref 0.3–1.2)

## 2012-04-15 LAB — RAPID URINE DRUG SCREEN, HOSP PERFORMED
Amphetamines: NOT DETECTED
Barbiturates: POSITIVE — AB
Opiates: POSITIVE — AB
Tetrahydrocannabinol: NOT DETECTED

## 2012-04-15 LAB — CBC WITH DIFFERENTIAL/PLATELET
HCT: 41.8 % (ref 39.0–52.0)
Hemoglobin: 15.1 g/dL (ref 13.0–17.0)
Lymphocytes Relative: 24 % (ref 12–46)
MCHC: 36.1 g/dL — ABNORMAL HIGH (ref 30.0–36.0)
Monocytes Absolute: 0.8 10*3/uL (ref 0.1–1.0)
Monocytes Relative: 7 % (ref 3–12)
Neutro Abs: 7.8 10*3/uL — ABNORMAL HIGH (ref 1.7–7.7)
WBC: 11.5 10*3/uL — ABNORMAL HIGH (ref 4.0–10.5)

## 2012-04-15 LAB — POCT I-STAT TROPONIN I: Troponin i, poc: 0 ng/mL (ref 0.00–0.08)

## 2012-04-15 MED ORDER — HYDROMORPHONE HCL PF 1 MG/ML IJ SOLN
1.0000 mg | Freq: Once | INTRAMUSCULAR | Status: AC
  Administered 2012-04-15: 1 mg via INTRAVENOUS
  Filled 2012-04-15: qty 1

## 2012-04-15 MED ORDER — SODIUM CHLORIDE 0.9 % IV BOLUS (SEPSIS)
1000.0000 mL | Freq: Once | INTRAVENOUS | Status: AC
  Administered 2012-04-15: 1000 mL via INTRAVENOUS

## 2012-04-15 MED ORDER — HYDROCODONE-ACETAMINOPHEN 5-325 MG PO TABS: 1.0000 | ORAL_TABLET | Freq: Four times a day (QID) | ORAL | Status: DC | PRN

## 2012-04-15 MED ORDER — LORAZEPAM 2 MG/ML IJ SOLN
1.0000 mg | Freq: Once | INTRAMUSCULAR | Status: AC
  Administered 2012-04-15: 1 mg via INTRAVENOUS
  Filled 2012-04-15: qty 1

## 2012-04-15 MED ORDER — HYDROMORPHONE HCL PF 2 MG/ML IJ SOLN
2.0000 mg | Freq: Once | INTRAMUSCULAR | Status: AC
  Administered 2012-04-15: 2 mg via INTRAVENOUS
  Filled 2012-04-15: qty 1

## 2012-04-15 MED ORDER — LORAZEPAM 1 MG PO TABS: 1.0000 mg | ORAL_TABLET | Freq: Three times a day (TID) | ORAL | Status: DC | PRN

## 2012-04-15 NOTE — ED Notes (Signed)
Pt complains of sudden onset of CP during introrse with SOB, lightlessness, and diaphoresis. Pt states he was diagnosed Tues with pericarditis.

## 2012-04-15 NOTE — ED Provider Notes (Signed)
History     CSN: [id number]  Arrival date & time 04/14/12  2353   First MD Initiated Contact with Patient 04/15/12 0006      Chief Complaint  Patient presents with  . Chest Pain    (Consider location/radiation/quality/duration/timing/severity/associated sxs/prior treatment) Patient is a 37 y.o. male presenting with chest pain. The history is provided by the patient (`pt  complains of chest pain and anxiety).  Chest Pain Pain location:  Substernal area Pain quality: aching   Pain radiates to:  Does not radiate Pain radiates to the back: no   Pain severity:  Moderate Onset quality:  Sudden Timing:  Intermittent Progression:  Partially resolved Chronicity:  Recurrent Context: not breathing   Associated symptoms: no abdominal pain, no back pain, no cough, no fatigue and no headache     History reviewed. No pertinent past medical history.  History reviewed. No pertinent past surgical history.  History reviewed. No pertinent family history.  History  Substance Use Topics  . Smoking status: Current Every Day Smoker  . Smokeless tobacco: Not on file  . Alcohol Use: No      Review of Systems  Constitutional: Negative for fatigue.  HENT: Negative for congestion, sinus pressure and ear discharge.   Eyes: Negative for discharge.  Respiratory: Negative for cough.   Cardiovascular: Positive for chest pain.  Gastrointestinal: Negative for abdominal pain and diarrhea.  Genitourinary: Negative for frequency and hematuria.  Musculoskeletal: Negative for back pain.  Skin: Negative for rash.  Neurological: Negative for seizures and headaches.  Psychiatric/Behavioral: Positive for agitation. Negative for hallucinations.    Allergies  Review of patient's allergies indicates no known allergies.  Home Medications   Current Outpatient Rx  Name  Route  Sig  Dispense  Refill  . diphenhydrAMINE (BENADRYL) 25 MG tablet   Oral   Take 50 mg by mouth every 6 (six) hours as needed  for itching.         Marland Kitchen ibuprofen (ADVIL,MOTRIN) 800 MG tablet   Oral   Take 1 tablet (800 mg total) by mouth 3 (three) times daily.   21 tablet   0   . Naproxen Sodium (ALEVE PO)   Oral   Take 1 tablet by mouth daily as needed (for knee pain.).         Marland Kitchen pantoprazole (PROTONIX) 40 MG tablet   Oral   Take 1 tablet (40 mg total) by mouth daily.   30 tablet   3   . Ranitidine HCl (ZANTAC PO)   Oral   Take 1 tablet by mouth daily as needed (for indigestion).         . TraZODone HCl 150 MG TB24   Oral   Take 1 tablet by mouth daily.         Marland Kitchen HYDROcodone-acetaminophen (NORCO/VICODIN) 5-325 MG per tablet   Oral   Take 1 tablet by mouth every 6 (six) hours as needed for pain.   20 tablet   0   . LORazepam (ATIVAN) 1 MG tablet   Oral   Take 1 tablet (1 mg total) by mouth 3 (three) times daily as needed for anxiety.   20 tablet   0     BP 141/94  Pulse 88  Resp 19  SpO2 91%  Physical Exam  Constitutional: He is oriented to person, place, and time. He appears well-developed.  HENT:  Head: Normocephalic and atraumatic.  Eyes: Conjunctivae and EOM are normal. No scleral icterus.  Neck: Neck supple.  No thyromegaly present.  Cardiovascular: Normal rate and regular rhythm.  Exam reveals no gallop and no friction rub.   No murmur heard. Pulmonary/Chest: No stridor. He has no wheezes. He has no rales. He exhibits no tenderness.  Abdominal: He exhibits no distension. There is no tenderness. There is no rebound.  Musculoskeletal: Normal range of motion. He exhibits no edema.  Lymphadenopathy:    He has no cervical adenopathy.  Neurological: He is oriented to person, place, and time. Coordination normal.  Skin: No rash noted. No erythema.  Psychiatric:  Very anxious    ED Course  Procedures (including critical care time)  Labs Reviewed  CBC WITH DIFFERENTIAL - Abnormal; Notable for the following:    WBC 11.5 (*)    MCHC 36.1 (*)    Neutro Abs 7.8 (*)    All  other components within normal limits  COMPREHENSIVE METABOLIC PANEL - Abnormal; Notable for the following:    Glucose, Bld 110 (*)    GFR calc non Af Amer 80 (*)    All other components within normal limits  URINE RAPID DRUG SCREEN (HOSP PERFORMED) - Abnormal; Notable for the following:    Opiates POSITIVE (*)    Barbiturates POSITIVE (*)    All other components within normal limits  POCT I-STAT TROPONIN I   Dg Chest Port 1 View  04/15/2012  *RADIOLOGY REPORT*  Clinical Data: Sudden onset of chest pain during intercourse with shortness of breath, lightheadedness, and diaphoresis.  Recent diagnosis of pericarditis.  PORTABLE CHEST - 1 VIEW  Comparison: 04/08/2012  Findings: Mild hyperinflation. The heart size and pulmonary vascularity are normal. The lungs appear clear and expanded without focal air space disease or consolidation. No blunting of the costophrenic angles.  No pneumothorax.  Mediastinal contours appear intact.  Stable appearance since previous study.  IMPRESSION: No evidence of active pulmonary disease.   Original Report Authenticated By: Burman Nieves, M.D.      1. Pericarditis   2. Anxiety      Date: 04/15/2012  Rate:103  Rhythm: normal sinus rhythm  QRS Axis: normal  Intervals: normal  ST/T Wave abnormalities:elevated st consistent with pericarditis  Conduction Disutrbances:none  Narrative Interpretation:   Old EKG Reviewed: unchanged    MDM          Benny Lennert, MD 04/15/12 [phone]

## 2012-04-15 NOTE — ED Notes (Signed)
MD Zammit at bedside to re-assess patient.

## 2012-04-15 NOTE — ED Notes (Signed)
Pt sitting up in bed, respiration rate and depth normal, pt no longer shaking, continues to c/o pain.

## 2012-08-18 ENCOUNTER — Emergency Department (HOSPITAL_COMMUNITY)
Admission: EM | Admit: 2012-08-18 | Discharge: 2012-08-19 | Disposition: A | Discharge status: Home / Self Care | Payer: Self-pay | Attending: Emergency Medicine | Admitting: Emergency Medicine

## 2012-08-18 ENCOUNTER — Encounter (HOSPITAL_COMMUNITY): Payer: Self-pay | Admitting: Nurse Practitioner

## 2012-08-18 ENCOUNTER — Emergency Department (HOSPITAL_COMMUNITY): Payer: Self-pay

## 2012-08-18 DIAGNOSIS — R0789 Other chest pain: Secondary | ICD-10-CM | POA: Insufficient documentation

## 2012-08-18 DIAGNOSIS — Z8679 Personal history of other diseases of the circulatory system: Secondary | ICD-10-CM | POA: Insufficient documentation

## 2012-08-18 DIAGNOSIS — R0602 Shortness of breath: Secondary | ICD-10-CM | POA: Insufficient documentation

## 2012-08-18 DIAGNOSIS — R079 Chest pain, unspecified: Secondary | ICD-10-CM

## 2012-08-18 DIAGNOSIS — F17200 Nicotine dependence, unspecified, uncomplicated: Secondary | ICD-10-CM | POA: Insufficient documentation

## 2012-08-18 HISTORY — DX: Disease of pericardium, unspecified: I31.9

## 2012-08-18 LAB — BASIC METABOLIC PANEL
Chloride: 98 mEq/L (ref 96–112)
GFR calc Af Amer: 60 mL/min — ABNORMAL LOW (ref >90)
GFR calc non Af Amer: 52 mL/min — ABNORMAL LOW (ref >90)
Potassium: 3.5 mEq/L (ref 3.5–5.1)

## 2012-08-18 LAB — CBC
MCHC: 36.1 g/dL — ABNORMAL HIGH (ref 30.0–36.0)
Platelets: 279 10*3/uL (ref 150–400)
RDW: 14.4 % (ref 11.5–15.5)
WBC: 11 10*3/uL — ABNORMAL HIGH (ref 4.0–10.5)

## 2012-08-18 LAB — URINALYSIS, ROUTINE W REFLEX MICROSCOPIC
Ketones, ur: 40 mg/dL — AB
Leukocytes, UA: NEGATIVE
Nitrite: NEGATIVE
Specific Gravity, Urine: 1.021 (ref 1.005–1.030)
Urobilinogen, UA: 1 mg/dL (ref 0.0–1.0)
pH: 6 (ref 5.0–8.0)

## 2012-08-18 LAB — URINE MICROSCOPIC-ADD ON

## 2012-08-18 LAB — TROPONIN I: Troponin I: <0.3 ng/mL (ref <0.30)

## 2012-08-18 MED ORDER — MORPHINE SULFATE 4 MG/ML IJ SOLN
4.0000 mg | Freq: Once | INTRAMUSCULAR | Status: AC
  Administered 2012-08-18: 4 mg via INTRAVENOUS
  Filled 2012-08-18: qty 1

## 2012-08-18 MED ORDER — ASPIRIN 325 MG PO TABS
325.0000 mg | ORAL_TABLET | Freq: Once | ORAL | Status: AC
  Administered 2012-08-18: 325 mg via ORAL
  Filled 2012-08-18: qty 1

## 2012-08-18 MED ORDER — ALBUTEROL SULFATE (5 MG/ML) 0.5% IN NEBU
5.0000 mg | INHALATION_SOLUTION | Freq: Once | RESPIRATORY_TRACT | Status: AC
  Administered 2012-08-18: 5 mg via RESPIRATORY_TRACT
  Filled 2012-08-18: qty 1

## 2012-08-18 MED ORDER — KETOROLAC TROMETHAMINE 30 MG/ML IJ SOLN
30.0000 mg | Freq: Once | INTRAMUSCULAR | Status: AC
  Administered 2012-08-18: 30 mg via INTRAVENOUS
  Filled 2012-08-18: qty 1

## 2012-08-18 NOTE — ED Notes (Addendum)
Pt reports he was at work today and every time he bent over he got dizzy when he stood back up. Then he began to feel like he couldn't breathe well, "like i was choking, like i couldn't get all my air in." then began to have heaviness in chest. Went home from work to rest and he began to feel more SOB again so decided to come to er. Pt also has not voided since 0830 today and has been drinking all day.

## 2012-08-18 NOTE — ED Notes (Signed)
Patient attempted to urinate for urinalysis. Patient stated that he has not urinated since this morning and that he has the urge to urinate.

## 2012-08-18 NOTE — ED Notes (Signed)
Pt mentioned that he works at a 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

## 2012-08-19 MED ORDER — IBUPROFEN 600 MG PO TABS: 600.0000 mg | ORAL_TABLET | Freq: Three times a day (TID) | ORAL | Status: DC | PRN

## 2012-08-19 MED ORDER — MORPHINE SULFATE 4 MG/ML IJ SOLN
4.0000 mg | Freq: Once | INTRAMUSCULAR | Status: AC
  Administered 2012-08-19: 4 mg via INTRAVENOUS
  Filled 2012-08-19: qty 1

## 2012-08-19 MED ORDER — HYDROCODONE-ACETAMINOPHEN 5-325 MG PO TABS: 1.0000 | ORAL_TABLET | ORAL | Status: DC | PRN

## 2012-08-19 NOTE — ED Provider Notes (Signed)
CSN: [id number]     Arrival date & time 08/18/12  1845 History     First MD Initiated Contact with Patient 08/18/12 1929     Chief Complaint  Patient presents with  . Chest Pain    The history is provided by the patient.   patient reports developing some central sharp chest pain that began today.  He states is worse when he lays flat and worse when takes a deep breath.  His had a history of pericarditis before and states this feels somewhat similarly.  The one thing that concerned him this evening was he develops a more associated shortness of breath.  This seemed to make him concerned.  Because he felt like he was having difficulty breathing he came the emergency department for evaluation.  His pain is moderate in severity at this time.  Nothing improves his pain.  He did not try any pain medication prior to arrival  Past Medical History  Diagnosis Date  . Pericarditis    History reviewed. No pertinent past surgical history. History reviewed. No pertinent family history. History  Substance Use Topics  . Smoking status: Current Every Day Smoker  . Smokeless tobacco: Not on file  . Alcohol Use: No    Review of Systems  Cardiovascular: Positive for chest pain.  All other systems reviewed and are negative.    Allergies  Bee venom  Home Medications   Current Outpatient Rx  Name  Route  Sig  Dispense  Refill  . HYDROcodone-acetaminophen (NORCO/VICODIN) 5-325 MG per tablet   Oral   Take 1 tablet by mouth every 4 (four) hours as needed for pain.   15 tablet   0   . ibuprofen (ADVIL,MOTRIN) 600 MG tablet   Oral   Take 1 tablet (600 mg total) by mouth every 8 (eight) hours as needed for pain.   15 tablet   0    BP 124/59  Pulse 107  Temp(Src) 98.1 F (36.7 C)  Resp 24  SpO2 98% Physical Exam  Nursing note and vitals reviewed. Constitutional: He is oriented to person, place, and time. He appears well-developed and well-nourished.  HENT:  Head: Normocephalic and  atraumatic.  Eyes: EOM are normal.  Neck: Normal range of motion.  Cardiovascular: Regular rhythm, normal heart sounds and intact distal pulses.   Tachycardia  Pulmonary/Chest: Effort normal and breath sounds normal. No respiratory distress.  Abdominal: Soft. He exhibits no distension. There is no tenderness.  Genitourinary: Rectum normal.  Musculoskeletal: Normal range of motion.  Neurological: He is alert and oriented to person, place, and time.  Skin: Skin is warm and dry.  Psychiatric:  Anxious appearing    ED Course   Procedures    Date: 08/19/2012  Rate: 127  Rhythm: Sinus tachycardia.  Marland Kitchen  QRS Axis: normal  Intervals: normal  ST/T Wave abnormalities: Diffuse ST elevation consistent with early repolarization or pericarditis.  Mild PR depression  Conduction Disutrbances: none  Narrative Interpretation:   Old EKG Reviewed: No significant changes noted     Labs Reviewed  CBC - Abnormal; Notable for the following:    WBC 11.0 (*)    MCHC 36.1 (*)    All other components within normal limits  BASIC METABOLIC PANEL - Abnormal; Notable for the following:    Creatinine, Ser 1.65 (*)    GFR calc non Af Amer 52 (*)    GFR calc Af Amer 60 (*)    All other components within normal limits  URINALYSIS, ROUTINE W REFLEX MICROSCOPIC - Abnormal; Notable for the following:    Color, Urine AMBER (*)    APPearance CLOUDY (*)    Bilirubin Urine MODERATE (*)    Ketones, ur 40 (*)    Protein, ur 100 (*)    All other components within normal limits  URINE MICROSCOPIC-ADD ON - Abnormal; Notable for the following:    Bacteria, UA FEW (*)    Casts HYALINE CASTS (*)    All other components within normal limits  URINE CULTURE  D-DIMER, QUANTITATIVE  TROPONIN I   Dg Chest Portable 1 View  08/18/2012   *RADIOLOGY REPORT*  Clinical Data: Chest pain.  Dizziness.  Smoker.  PORTABLE CHEST - 1 VIEW  Comparison: 04/15/2012  Findings: Numerous leads and wires project over the chest.  Patient  rotated minimally left. Midline trachea.  Normal heart size and mediastinal contours. No pleural effusion or pneumothorax.  Clear lungs.  IMPRESSION: No acute cardiopulmonary disease.   Original Report Authenticated By: Jeronimo Greaves, M.D.   I personally reviewed the imaging tests through PACS system I reviewed available ER/hospitalization records through the EMR   1. Chest pain     MDM  12:03 AM This may represent pericarditis versus anxiety.  He feels much better this time.  Discharge home in good condition.  There were some subtle abnormalities his EKG however I discussed this with the on-call cardiology Dr. Sharyn Lull who agrees these are all findings consistent with early re\re pole.  Question PR depression which may represent his findings of pericarditis.  Home with anti-inflammatories.  Bedside ultrasound demonstrates no urinary retention  Lyanne Co, MD 08/19/12 [phone]

## 2012-08-20 LAB — URINE CULTURE
Colony Count: NO GROWTH
Culture: NO GROWTH

## 2012-08-21 ENCOUNTER — Emergency Department (INDEPENDENT_AMBULATORY_CARE_PROVIDER_SITE_OTHER)
Admission: EM | Admit: 2012-08-21 | Discharge: 2012-08-21 | Disposition: A | Discharge status: Nursing Home (Includes ICF) | Payer: Self-pay | Source: Home / Self Care

## 2012-08-21 ENCOUNTER — Emergency Department (HOSPITAL_COMMUNITY)
Admission: EM | Admit: 2012-08-21 | Discharge: 2012-08-21 | Disposition: A | Discharge status: Home / Self Care | Payer: Self-pay | Attending: Emergency Medicine | Admitting: Emergency Medicine

## 2012-08-21 ENCOUNTER — Encounter (HOSPITAL_COMMUNITY): Payer: Self-pay | Admitting: *Deleted

## 2012-08-21 ENCOUNTER — Emergency Department (HOSPITAL_COMMUNITY): Payer: Self-pay

## 2012-08-21 ENCOUNTER — Encounter (HOSPITAL_COMMUNITY): Payer: Self-pay | Admitting: Emergency Medicine

## 2012-08-21 DIAGNOSIS — R0602 Shortness of breath: Secondary | ICD-10-CM | POA: Insufficient documentation

## 2012-08-21 DIAGNOSIS — R079 Chest pain, unspecified: Secondary | ICD-10-CM

## 2012-08-21 DIAGNOSIS — F17200 Nicotine dependence, unspecified, uncomplicated: Secondary | ICD-10-CM | POA: Insufficient documentation

## 2012-08-21 DIAGNOSIS — Z8679 Personal history of other diseases of the circulatory system: Secondary | ICD-10-CM | POA: Insufficient documentation

## 2012-08-21 DIAGNOSIS — R072 Precordial pain: Secondary | ICD-10-CM | POA: Insufficient documentation

## 2012-08-21 DIAGNOSIS — R9431 Abnormal electrocardiogram [ECG] [EKG]: Secondary | ICD-10-CM | POA: Insufficient documentation

## 2012-08-21 LAB — CBC
HCT: 41.6 % (ref 39.0–52.0)
Hemoglobin: 15.4 g/dL (ref 13.0–17.0)
MCH: 33.3 pg (ref 26.0–34.0)
MCHC: 37 g/dL — ABNORMAL HIGH (ref 30.0–36.0)

## 2012-08-21 LAB — URINALYSIS, ROUTINE W REFLEX MICROSCOPIC
Glucose, UA: NEGATIVE mg/dL
Hgb urine dipstick: NEGATIVE
Protein, ur: NEGATIVE mg/dL

## 2012-08-21 LAB — COMPREHENSIVE METABOLIC PANEL
ALT: 29 U/L (ref 0–53)
AST: 26 U/L (ref 0–37)
Albumin: 3.7 g/dL (ref 3.5–5.2)
Calcium: 9.2 mg/dL (ref 8.4–10.5)
GFR calc Af Amer: 74 mL/min — ABNORMAL LOW (ref >90)
Sodium: 138 mEq/L (ref 135–145)
Total Protein: 6.8 g/dL (ref 6.0–8.3)

## 2012-08-21 LAB — POCT I-STAT TROPONIN I: Troponin i, poc: 0.01 ng/mL (ref 0.00–0.08)

## 2012-08-21 MED ORDER — HYDROCODONE-ACETAMINOPHEN 5-325 MG PO TABS
ORAL_TABLET | ORAL | Status: AC
  Filled 2012-08-21: qty 2

## 2012-08-21 MED ORDER — OXYCODONE-ACETAMINOPHEN 5-325 MG PO TABS: 1.0000 | ORAL_TABLET | Freq: Four times a day (QID) | ORAL | Status: DC | PRN

## 2012-08-21 MED ORDER — KETOROLAC TROMETHAMINE 30 MG/ML IJ SOLN
30.0000 mg | Freq: Once | INTRAMUSCULAR | Status: AC
  Administered 2012-08-21: 30 mg via INTRAVENOUS
  Filled 2012-08-21: qty 1

## 2012-08-21 MED ORDER — OMEPRAZOLE 20 MG PO CPDR: DELAYED_RELEASE_CAPSULE | ORAL | Status: DC

## 2012-08-21 MED ORDER — OXYCODONE-ACETAMINOPHEN 5-325 MG PO TABS
2.0000 | ORAL_TABLET | Freq: Once | ORAL | Status: AC
  Administered 2012-08-21: 2 via ORAL
  Filled 2012-08-21: qty 2

## 2012-08-21 MED ORDER — IBUPROFEN 600 MG PO TABS: 600.0000 mg | ORAL_TABLET | Freq: Four times a day (QID) | ORAL | Status: DC | PRN

## 2012-08-21 MED ORDER — HYDROCODONE-ACETAMINOPHEN 5-325 MG PO TABS
2.0000 | ORAL_TABLET | Freq: Once | ORAL | Status: AC
  Administered 2012-08-21: 2 via ORAL

## 2012-08-21 MED ORDER — SODIUM CHLORIDE 0.9 % IV BOLUS (SEPSIS)
1000.0000 mL | Freq: Once | INTRAVENOUS | Status: AC
  Administered 2012-08-21: 1000 mL via INTRAVENOUS

## 2012-08-21 NOTE — ED Notes (Signed)
EKG completed at [facility] and pt given pain medication PTA.

## 2012-08-21 NOTE — ED Notes (Signed)
Pt. At x ray

## 2012-08-21 NOTE — ED Notes (Signed)
Was seen here before and dx w/ pericarditis went to work and the cp got worse andso did sob

## 2012-08-21 NOTE — ED Notes (Signed)
Pt  Seen  Recently  Er  For possible  Pericarditis        Had  Tests  Done     -  Sent  Home      Pt      Reports  Pain  When he  Takes  A  Deep  Breath        And  Has  Chest  Pain  At this  Time   -  He  Was    At  Work  Today        He  Reports   Pain  Not  releived  By pain meds  rx        He  Is  Awake  And  Somewhat  Anxious

## 2012-08-21 NOTE — ED Provider Notes (Signed)
Medical screening examination/treatment/procedure(s) were performed by non-physician practitioner and as supervising physician I was immediately available for consultation/collaboration.  Ethelda Chick, MD 08/21/12 1356

## 2012-08-21 NOTE — ED Provider Notes (Signed)
Medical screening examination/treatment/procedure(s) were performed by non-physician practitioner and as supervising physician I was immediately available for consultation/collaboration.  Leslee Home, M.D.  Reuben Likes, MD 08/21/12 [phone]

## 2012-08-21 NOTE — ED Provider Notes (Signed)
CSN: [id number]     Arrival date & time 08/21/12  1025 History     First MD Initiated Contact with Patient 08/21/12 1110     Chief Complaint  Patient presents with  . Chest Pain   (Consider location/radiation/quality/duration/timing/severity/associated sxs/prior Treatment) HPI Comments: Patient with history of presumed pericarditis -- presents with continued chest pain that began acutely 3 days ago. Patient was seen in emergency department and started on NSAIDs, pain medication. Patient returned to work today and had recurrence of pain and shortness of breath. He decided to come to the emergency department for reevaluation. Pain is midsternal, worse with sitting up, not reproducible with palpation of the chest wall. Patient denies fever, cough, nausea or vomiting. No history of blood clots or other risk factors for PE. The onset of this condition was acute. The course is constant.    Patient is a 37 y.o. male presenting with chest pain. The history is provided by the patient.  Chest Pain Associated symptoms: shortness of breath   Associated symptoms: no abdominal pain, no back pain, no cough, no diaphoresis, no fever, no nausea, no palpitations and not vomiting     Past Medical History  Diagnosis Date  . Pericarditis    History reviewed. No pertinent past surgical history. No family history on file. History  Substance Use Topics  . Smoking status: Current Every Day Smoker  . Smokeless tobacco: Not on file  . Alcohol Use: Yes    Review of Systems  Constitutional: Negative for fever and diaphoresis.  HENT: Negative for neck pain.   Eyes: Negative for redness.  Respiratory: Positive for shortness of breath. Negative for cough.   Cardiovascular: Positive for chest pain. Negative for palpitations and leg swelling.  Gastrointestinal: Negative for nausea, vomiting and abdominal pain.  Genitourinary: Negative for dysuria.  Musculoskeletal: Negative for back pain.  Skin: Negative for  rash.  Neurological: Negative for syncope and light-headedness.    Allergies  Bee venom  Home Medications   Current Outpatient Rx  Name  Route  Sig  Dispense  Refill  . HYDROcodone-acetaminophen (NORCO/VICODIN) 5-325 MG per tablet   Oral   Take 1 tablet by mouth every 4 (four) hours as needed for pain.   15 tablet   0   . ibuprofen (ADVIL,MOTRIN) 600 MG tablet   Oral   Take 1 tablet (600 mg total) by mouth every 8 (eight) hours as needed for pain.   15 tablet   0    BP 120/65  Pulse 93  Temp(Src) 98.1 F (36.7 C)  Resp 16  SpO2 96% Physical Exam  Nursing note and vitals reviewed. Constitutional: He appears well-developed and well-nourished.  HENT:  Head: Normocephalic and atraumatic.  Mouth/Throat: Mucous membranes are normal. Mucous membranes are not dry.  Eyes: Conjunctivae are normal.  Neck: Trachea normal and normal range of motion. Neck supple. Normal carotid pulses and no JVD present. No muscular tenderness present. Carotid bruit is not present. No tracheal deviation present.  Cardiovascular: Normal rate, regular rhythm, S1 normal, S2 normal, normal heart sounds and intact distal pulses.  Exam reveals no distant heart sounds, no friction rub and no decreased pulses.   No murmur heard. Pulses:      Radial pulses are 2+ on the right side, and 2+ on the left side.  Pulmonary/Chest: Effort normal and breath sounds normal. No respiratory distress. He has no wheezes. He exhibits no tenderness.  Abdominal: Soft. Normal aorta and bowel sounds are normal. There is  no tenderness. There is no rebound and no guarding.  Musculoskeletal: He exhibits no edema and no tenderness.  No calf swelling or pain.   Neurological: He is alert.  Skin: Skin is warm and dry. He is not diaphoretic. No cyanosis. No pallor.  Psychiatric: He has a normal mood and affect.    ED Course   Procedures (including critical care time)  Labs Reviewed  COMPREHENSIVE METABOLIC PANEL - Abnormal;  Notable for the following:    Glucose, Bld 66 (*)    BUN 27 (*)    Creatinine, Ser 1.40 (*)    Total Bilirubin 0.2 (*)    GFR calc non Af Amer 63 (*)    GFR calc Af Amer 74 (*)    All other components within normal limits  CBC - Abnormal; Notable for the following:    WBC 12.6 (*)    MCHC 37.0 (*)    All other components within normal limits  URINALYSIS, ROUTINE W REFLEX MICROSCOPIC - Abnormal; Notable for the following:    Color, Urine AMBER (*)    Bilirubin Urine SMALL (*)    Ketones, ur 15 (*)    All other components within normal limits  POCT I-STAT TROPONIN I   Dg Chest 2 View  08/21/2012   *RADIOLOGY REPORT*  Clinical Data: Chest pain  CHEST - 2 VIEW  Comparison: August 18, 2012  Findings: There is no edema or consolidation.  The heart size and pulmonary vascularity are normal.  No adenopathy. No pneumothorax. No bone lesions.  IMPRESSION: No abnormality noted.   Original Report Authenticated By: Bretta Bang, M.D.   1. Chest pain   2. Abnormal EKG     11:58 AM Patient seen and examined. EKG reviewed. Work-up initiated. Medications ordered.   Vital signs reviewed and are as follows: Filed Vitals:   08/21/12 1149  BP: 120/65  Pulse:   Temp:   Resp: 16    Date: 08/21/2012  Rate: 82  Rhythm: normal sinus rhythm  QRS Axis: normal  Intervals: normal  ST/T Wave abnormalities: nonspecific ST/T changes  Conduction Disutrbances:none  Narrative Interpretation: PR depression II, III  Old EKG Reviewed: unchanged from 08/04  1:50 PM Pt informed of results. Previous d-dimer neg.   D/c home with pain meds, continue ibuprofen, omeprazole.  Urged cardiology f/u.  Patient was counseled to return with severe chest pain, especially if the pain is crushing or pressure-like and spreads to the arms, back, neck, or jaw, or if they have sweating, nausea, or shortness of breath with the pain. They were encouraged to call 911 with these symptoms.   They were also told to return if  their chest pain gets worse and does not go away with rest, they have an attack of chest pain lasting longer than usual despite rest and treatment with the medications their caregiver has prescribed, if they wake from sleep with chest pain or shortness of breath, if they feel dizzy or faint, if they have chest pain not typical of their usual pain, or if they have any other emergent concerns regarding their health.  The patient verbalized understanding and agreed.   MDM  Pt with CP, abnormal EKG. Moderate concern for pericarditis without effusion/tamponade given history and presentation. Subtle PR depression, diffuse ST seg elevation again noted. Do not feel this represents ACS. Pt appears well, but uncomfortable. Will d/c to home with continued therapy and cardiology f/u, as this is not patient's first episode. Do not suspect PE, myocarditis.  Neco Kling, PA-C 08/21/12 1353

## 2012-08-21 NOTE — ED Provider Notes (Signed)
  CSN: [id number]     Arrival date & time 08/21/12  0913 History     First MD Initiated Contact with Patient 08/21/12 0957     Chief Complaint  Patient presents with  . Chest Pain   (Consider location/radiation/quality/duration/timing/severity/associated sxs/prior Treatment) HPI  37 yo wm comes in with complaints of chest pain.  Was seen at the ED 4 aug for the same complaint and dx with possible pericarditis and put on anti-inflammatory medication.  States that he continues to have chest pain that is constant and worse with taking a deep breath.  Admits dyspnea. Pain radiates to left shoulder and epigastric area.  Denies fever, chills.    Past Medical History  Diagnosis Date  . Pericarditis    History reviewed. No pertinent past surgical history. History reviewed. No pertinent family history. History  Substance Use Topics  . Smoking status: Current Every Day Smoker  . Smokeless tobacco: Not on file  . Alcohol Use: No    Review of Systems  Constitutional: Positive for activity change. Negative for fever.  Respiratory: Positive for chest tightness and shortness of breath.   Cardiovascular: Positive for chest pain.  Gastrointestinal: Positive for abdominal pain.  Psychiatric/Behavioral: Positive for agitation.    Allergies  Bee venom  Home Medications   Current Outpatient Rx  Name  Route  Sig  Dispense  Refill  . HYDROcodone-acetaminophen (NORCO/VICODIN) 5-325 MG per tablet   Oral   Take 1 tablet by mouth every 4 (four) hours as needed for pain.   15 tablet   0   . ibuprofen (ADVIL,MOTRIN) 600 MG tablet   Oral   Take 1 tablet (600 mg total) by mouth every 8 (eight) hours as needed for pain.   15 tablet   0    BP 121/66  Pulse 85  Temp(Src) 98.4 F (36.9 C)  SpO2 99% Physical Exam  Constitutional: He is oriented to person, place, and time. He appears well-developed and well-nourished. No distress.  HENT:  Head: Normocephalic and atraumatic.  Eyes: EOM are  normal. Pupils are equal, round, and reactive to light.  Neck: Normal range of motion.  Cardiovascular: Normal rate and normal heart sounds.   Pulmonary/Chest: Effort normal and breath sounds normal.  Abdominal: Soft. There is tenderness.  Musculoskeletal: Normal range of motion.  Neurological: He is alert and oriented to person, place, and time.  Skin: Skin is warm and dry.  Psychiatric: He has a normal mood and affect.    ED Course   Procedures (including critical care time)  Labs Reviewed - No data to display No results found. No diagnosis found.  MDM  Will transfer patient to ED for evaluation of chest pain.  Will likely need cardiology consult.    Zonia Kief, PA-C 08/21/12 1040

## 2012-08-21 NOTE — ED Notes (Signed)
Josh PA at bedside   

## 2012-10-26 ENCOUNTER — Encounter (HOSPITAL_COMMUNITY): Payer: Self-pay | Admitting: Emergency Medicine

## 2012-10-26 ENCOUNTER — Emergency Department (HOSPITAL_COMMUNITY): Payer: Self-pay

## 2012-10-26 ENCOUNTER — Observation Stay (HOSPITAL_COMMUNITY)
Admission: EM | Admit: 2012-10-26 | Discharge: 2012-10-26 | Disposition: A | Discharge status: Home / Self Care | Payer: Self-pay | Attending: Cardiovascular Disease | Admitting: Cardiovascular Disease

## 2012-10-26 DIAGNOSIS — R079 Chest pain, unspecified: Secondary | ICD-10-CM

## 2012-10-26 DIAGNOSIS — F17200 Nicotine dependence, unspecified, uncomplicated: Secondary | ICD-10-CM | POA: Insufficient documentation

## 2012-10-26 DIAGNOSIS — R0789 Other chest pain: Principal | ICD-10-CM | POA: Insufficient documentation

## 2012-10-26 DIAGNOSIS — I319 Disease of pericardium, unspecified: Secondary | ICD-10-CM | POA: Insufficient documentation

## 2012-10-26 LAB — CBC
HCT: 36.1 % — ABNORMAL LOW (ref 39.0–52.0)
Hemoglobin: 12.7 g/dL — ABNORMAL LOW (ref 13.0–17.0)
MCHC: 35.2 g/dL (ref 30.0–36.0)
RBC: 4.02 MIL/uL — ABNORMAL LOW (ref 4.22–5.81)
WBC: 13.3 10*3/uL — ABNORMAL HIGH (ref 4.0–10.5)

## 2012-10-26 LAB — POCT I-STAT TROPONIN I: Troponin i, poc: 0 ng/mL (ref 0.00–0.08)

## 2012-10-26 LAB — HEPATIC FUNCTION PANEL
ALT: 13 U/L (ref 0–53)
AST: 25 U/L (ref 0–37)
Albumin: 3.9 g/dL (ref 3.5–5.2)
Alkaline Phosphatase: 52 U/L (ref 39–117)
Total Bilirubin: 0.4 mg/dL (ref 0.3–1.2)

## 2012-10-26 LAB — BASIC METABOLIC PANEL
BUN: 24 mg/dL — ABNORMAL HIGH (ref 6–23)
Chloride: 105 mEq/L (ref 96–112)
GFR calc Af Amer: 73 mL/min — ABNORMAL LOW (ref >90)
Potassium: 3.3 mEq/L — ABNORMAL LOW (ref 3.5–5.1)

## 2012-10-26 MED ORDER — ONDANSETRON HCL 4 MG/2ML IJ SOLN: 4.0000 mg | Freq: Four times a day (QID) | INTRAMUSCULAR | Status: DC | PRN

## 2012-10-26 MED ORDER — ALPRAZOLAM 0.25 MG PO TABS
0.2500 mg | ORAL_TABLET | Freq: Every day | ORAL | Status: DC | PRN
  Administered 2012-10-26: 0.25 mg via ORAL
  Filled 2012-10-26: qty 1

## 2012-10-26 MED ORDER — MORPHINE SULFATE 4 MG/ML IJ SOLN
6.0000 mg | Freq: Once | INTRAMUSCULAR | Status: AC
  Administered 2012-10-26: 6 mg via INTRAVENOUS
  Filled 2012-10-26 (×2): qty 2

## 2012-10-26 MED ORDER — POTASSIUM CHLORIDE CRYS ER 20 MEQ PO TBCR
40.0000 meq | EXTENDED_RELEASE_TABLET | Freq: Once | ORAL | Status: AC
  Administered 2012-10-26: 40 meq via ORAL
  Filled 2012-10-26: qty 2

## 2012-10-26 MED ORDER — ALUM & MAG HYDROXIDE-SIMETH 200-200-20 MG/5ML PO SUSP: 30.0000 mL | Freq: Four times a day (QID) | ORAL | Status: DC | PRN

## 2012-10-26 MED ORDER — NICOTINE 21 MG/24HR TD PT24
21.0000 mg | MEDICATED_PATCH | Freq: Every day | TRANSDERMAL | Status: DC
  Administered 2012-10-26: 21 mg via TRANSDERMAL
  Filled 2012-10-26: qty 1

## 2012-10-26 MED ORDER — ONDANSETRON HCL 4 MG PO TABS: 4.0000 mg | ORAL_TABLET | Freq: Four times a day (QID) | ORAL | Status: DC | PRN

## 2012-10-26 MED ORDER — PANTOPRAZOLE SODIUM 40 MG PO TBEC
40.0000 mg | DELAYED_RELEASE_TABLET | Freq: Every day | ORAL | Status: DC
  Administered 2012-10-26: 40 mg via ORAL
  Filled 2012-10-26: qty 1

## 2012-10-26 MED ORDER — DOCUSATE SODIUM 100 MG PO CAPS
100.0000 mg | ORAL_CAPSULE | Freq: Two times a day (BID) | ORAL | Status: DC
  Filled 2012-10-26 (×2): qty 1

## 2012-10-26 MED ORDER — ASPIRIN EC 81 MG PO TBEC
81.0000 mg | DELAYED_RELEASE_TABLET | Freq: Every day | ORAL | Status: DC
  Administered 2012-10-26: 81 mg via ORAL
  Filled 2012-10-26: qty 1

## 2012-10-26 MED ORDER — SODIUM CHLORIDE 0.9 % IJ SOLN
3.0000 mL | Freq: Two times a day (BID) | INTRAMUSCULAR | Status: DC
  Administered 2012-10-26: 3 mL via INTRAVENOUS

## 2012-10-26 MED ORDER — ADULT MULTIVITAMIN W/MINERALS CH
1.0000 | ORAL_TABLET | Freq: Every day | ORAL | Status: DC
  Administered 2012-10-26: 1 via ORAL
  Filled 2012-10-26: qty 1

## 2012-10-26 MED ORDER — ONDANSETRON HCL 4 MG/2ML IJ SOLN
4.0000 mg | Freq: Once | INTRAMUSCULAR | Status: AC
  Administered 2012-10-26: 4 mg via INTRAVENOUS
  Filled 2012-10-26: qty 2

## 2012-10-26 MED ORDER — HEPARIN SODIUM (PORCINE) 5000 UNIT/ML IJ SOLN
5000.0000 [IU] | Freq: Three times a day (TID) | INTRAMUSCULAR | Status: DC
  Administered 2012-10-26: 5000 [IU] via SUBCUTANEOUS
  Filled 2012-10-26 (×4): qty 1

## 2012-10-26 MED ORDER — COLCHICINE 0.6 MG PO TABS
0.6000 mg | ORAL_TABLET | Freq: Every day | ORAL | Status: DC
  Administered 2012-10-26: 0.6 mg via ORAL
  Filled 2012-10-26: qty 1

## 2012-10-26 MED ORDER — HYDROCODONE-ACETAMINOPHEN 5-325 MG PO TABS
1.0000 | ORAL_TABLET | ORAL | Status: DC | PRN
  Administered 2012-10-26 (×2): 2 via ORAL
  Filled 2012-10-26 (×2): qty 2

## 2012-10-26 MED ORDER — IBUPROFEN 800 MG PO TABS
800.0000 mg | ORAL_TABLET | Freq: Every day | ORAL | Status: DC | PRN
  Administered 2012-10-26: 800 mg via ORAL
  Filled 2012-10-26 (×2): qty 1

## 2012-10-26 MED ORDER — NITROGLYCERIN 2 % TD OINT
1.0000 [in_us] | TOPICAL_OINTMENT | Freq: Once | TRANSDERMAL | Status: AC
  Administered 2012-10-26: 1 [in_us] via TOPICAL
  Filled 2012-10-26: qty 1

## 2012-10-26 NOTE — Progress Notes (Signed)
Utilization Review Completed.Kurt Mcdaniel T10/12/2012  

## 2012-10-26 NOTE — ED Notes (Signed)
Code STEMI called due to EKG changes.

## 2012-10-26 NOTE — ED Provider Notes (Signed)
CSN: [id number]     Arrival date & time 10/26/12  0127 History   First MD Initiated Contact with Patient 10/26/12 0208     Chief Complaint  Patient presents with  . Chest Pain   (Consider location/radiation/quality/duration/timing/severity/associated sxs/prior Treatment) HPI This patient is a 37 yo 1ppd smoker with a remote history of uremic pericarditis. He presents with complaints of severe left sided chest pain/pressure which began around 11pm. He was at a Halloween party when it began. Pain is aching, severe, 10/10, radiates to neck. Denies SOB and diaphoresis. Endorses nausea. Nothing seems to excacerbate or relieve pain. No diaphoresis  Patient says that his pain feels different from previous episode of pericarditis. Denies any recent cocaine use. No history of CAD. No history of functional cardiac work up. No CAD RF other than tobacco abuse.   Past Medical History  Diagnosis Date  . Pericarditis    History reviewed. No pertinent past surgical history. No family history on file. History  Substance Use Topics  . Smoking status: Current Every Day Smoker  . Smokeless tobacco: Not on file  . Alcohol Use: Yes    Review of Systems 10 point ROS performed and is negative with the exception of sx noted above.   Allergies  Bee venom  Home Medications   Current Outpatient Rx  Name  Route  Sig  Dispense  Refill  . HYDROcodone-acetaminophen (NORCO/VICODIN) 5-325 MG per tablet   Oral   Take 1 tablet by mouth every 4 (four) hours as needed for pain.   15 tablet   0   . ibuprofen (ADVIL,MOTRIN) 600 MG tablet   Oral   Take 1 tablet (600 mg total) by mouth every 8 (eight) hours as needed for pain.   15 tablet   0   . ibuprofen (ADVIL,MOTRIN) 600 MG tablet   Oral   Take 1 tablet (600 mg total) by mouth every 6 (six) hours as needed for pain.   20 tablet   0   . omeprazole (PRILOSEC) 20 MG capsule      Take one capsule PO twice a day for 3 days, then one capsule PO once a  day   20 capsule   0   . oxyCODONE-acetaminophen (PERCOCET/ROXICET) 5-325 MG per tablet   Oral   Take 1-2 tablets by mouth every 6 (six) hours as needed for pain.   10 tablet   0    There were no vitals taken for this visit. Physical Exam Gen: well developed and well nourished appearing, appears in significant distress Head: NCAT Eyes: PERL, EOMI Nose: no epistaixis or rhinorrhea Mouth/throat: mucosa is moist and pink Neck: supple, no stridor Lungs: CTA B, no wheezing, rhonchi or rales CV: RRR, no murmur, palp peripheral pulses Abd: soft, notender, nondistended Back: no ttp, no cva ttp Skin: no rashese, wnl Neuro: CN ii-xii grossly intact, no focal deficits Ext: no edema Psyche; normal affect,  calm and cooperative.  ED Course  Procedures (including critical care time)  Results for orders placed during the hospital encounter of 10/26/12 (from the past 24 hour(s))  BASIC METABOLIC PANEL     Status: Abnormal   Collection Time    10/26/12  2:25 AM      Result Value Range   Sodium 140  135 - 145 mEq/L   Potassium 3.3 (*) 3.5 - 5.1 mEq/L   Chloride 105  96 - 112 mEq/L   CO2 20  19 - 32 mEq/L   Glucose, Bld 77  70 - 99 mg/dL   BUN 24 (*) 6 - 23 mg/dL   Creatinine, Ser 1.19 (*) 0.50 - 1.35 mg/dL   Calcium 8.3 (*) 8.4 - 10.5 mg/dL   GFR calc non Af Amer 63 (*) >90 mL/min   GFR calc Af Amer 73 (*) >90 mL/min  CBC     Status: Abnormal   Collection Time    10/26/12  2:25 AM      Result Value Range   WBC 13.3 (*) 4.0 - 10.5 K/uL   RBC 4.02 (*) 4.22 - 5.81 MIL/uL   Hemoglobin 12.7 (*) 13.0 - 17.0 g/dL   HCT 14.7 (*) 82.9 - 56.2 %   MCV 89.8  78.0 - 100.0 fL   MCH 31.6  26.0 - 34.0 pg   MCHC 35.2  30.0 - 36.0 g/dL   RDW 13.0  86.5 - 78.4 %   Platelets 228  150 - 400 K/uL  HEPATIC FUNCTION PANEL     Status: None   Collection Time    10/26/12  2:25 AM      Result Value Range   Total Protein 6.3  6.0 - 8.3 g/dL   Albumin 3.9  3.5 - 5.2 g/dL   AST 25  0 - 37 U/L   ALT  13  0 - 53 U/L   Alkaline Phosphatase 52  39 - 117 U/L   Total Bilirubin 0.4  0.3 - 1.2 mg/dL   Bilirubin, Direct <6.9  0.0 - 0.3 mg/dL   Indirect Bilirubin NOT CALCULATED  0.3 - 0.9 mg/dL  APTT     Status: None   Collection Time    10/26/12  2:25 AM      Result Value Range   aPTT 32  24 - 37 seconds  PROTIME-INR     Status: None   Collection Time    10/26/12  2:25 AM      Result Value Range   Prothrombin Time 14.1  11.6 - 15.2 seconds   INR 1.11  0.00 - 1.49  SEDIMENTATION RATE     Status: None   Collection Time    10/26/12  2:25 AM      Result Value Range   Sed Rate 5  0 - 16 mm/hr  POCT I-STAT TROPONIN I     Status: None   Collection Time    10/26/12  2:43 AM      Result Value Range   Troponin i, poc 0.00  0.00 - 0.08 ng/mL   Comment 3              EKG: NSU, ST elevation of approximately 2 mm present in inferior and anterolateral leads. Compared to Aug 21, 2012, these findings are more pronounced in the inferior leads and new in the anterolateral leads.  Normal intervals, axis,   MDM  CODE STEMI activated at 0143 at the time of EKG interpretation. I received a call from Dr. Sharyn Lull at [phone], the patient's cardiologist. He asked me to tell him if the patient had STEMI or pericarditis. I told him that I could not be sure and thus, the code STEMI activation and request for his in person consultation. He said he did not want to come in if the patient was not having a STEMI. He said that he would log on remotely, evaluate EKG and call back. He did so within 2 minutes. His interpretation of the EKG is early repolorization. He says the patient is not suffering STEMI.  We are managing supportively and treating with ASA, NTG, 02, opiate, antiemetic. Awaiting results of CXR, cardiac markers.   (late entry)  Patient was promptly evaluated by the cardiology fellow on call and then cardiologist on call for Dr.  Sharyn Lull. The later cardiologist ultimately admitted the patient. First  troponin is wnl.     Brandt Loosen, MD 10/26/12 [phone]

## 2012-10-26 NOTE — Discharge Summary (Signed)
Physician Discharge Summary  Patient ID: Kurt Mcdaniel MRN: [id number] DOB/AGE: 10-09-1975 37 y.o.  Admit date: 10/26/2012 Discharge date: 10/26/2012  Admission Diagnoses: Chest pain of pericarditis  EKG with early repolarization.  Discharge Diagnoses:  Principle Problem: * Chest pain of pericarditis * Chronic pericarditis Anxiety EKG with early repolarization. Hypokalemia  Discharged Condition: good  Hospital Course: 37 years old male with history of pericarditis has central, non-radiating chest pain with shortness of breath. Chest pain is severe and positional. His blood work is unremarkable except mildly low potassium level. His echocardiogram showed normal LV systolic function with very little pericardial effusion. He had significant relief with colchicine and ibuprofen.  Consults: None  Significant Diagnostic Studies: labs: Mildly elevated WBC count. Mildly low potassium level of 3.3 treated with potassium supplement.  Echocardiogram showed normal LV systolic function EF 65 %. And trivial pericardial effusion.  Treatments: analgesia: ibuprofen, colchicine.  Discharge Exam: Blood pressure 114/65, pulse 52, temperature 97.8 F (36.6 C), temperature source Oral, resp. rate 18, height 6\' 3"  (1.905 m), weight 88.089 kg (194 lb 3.2 oz), SpO2 97.00%. Constitutional: He appears well-developed and well-nourished.  HENT: Head: Normocephalic and atraumatic. Mucous membranes are normal.  Eyes: Manson Passey, Conj-pink, Sclera-white.  Neck: Trachea normal and normal range of motion. Neck supple. Normal carotid pulses and no JVD present.  Cardiovascular: Normal rate, regular rhythm, S1 normal, S2 normal, Positive friction rub when lying supine. .  Pulmonary/Chest: Effort normal and breath sounds normal.  Abdominal: Soft and bowel sounds are normal. There is no tenderness.  Musculoskeletal: He exhibits no edema, cyanosis or clubbing.  Neurological: He is alert. Moves all 4 extremities.   Skin: Skin is warm and dry.  Psychiatric: He has a normal mood and affect.    Disposition: 01-Home or Self Care     Medication List         ALPRAZolam 0.25 MG tablet  Commonly known as:  XANAX  Take 0.25 mg by mouth daily as needed for sleep or anxiety.     colchicine 0.6 MG tablet  Take 0.6 mg by mouth daily.     ibuprofen 200 MG tablet  Commonly known as:  ADVIL,MOTRIN  Take 800 mg by mouth daily as needed for pain.     omeprazole 20 MG capsule  Commonly known as:  PRILOSEC  Take 20 mg by mouth 2 (two) times daily.           Follow-up Information   Follow up with Robynn Pane, MD. Schedule an appointment as soon as possible for a visit in 1 month.   Specialty:  Cardiology   Contact information:   [address] [place] [address] [phone]       Signed: Ricki Rodriguez 10/26/2012, 3:08 PM

## 2012-10-26 NOTE — ED Notes (Signed)
Pt brought to ED with SOB and chest pain,central and non radiating.Pt gives the history of pericarditis.Aspirin 325mg ,nitro s/lX 1 given by EMS.

## 2012-10-26 NOTE — H&P (Signed)
Kurt Mcdaniel is an 37 y.o. male.   Chief Complaint: Chest pain  HPI: 37 years old male with history of pericarditis has central, non-radiating chest pain with shortness of breath. Chest pain is severe and positional.  Past Medical History  Diagnosis Date  . Pericarditis       History reviewed. No pertinent past surgical history.  No family history on file. Social History:  reports that he has been smoking.  He does not have any smokeless tobacco history on file. He reports that he drinks alcohol. He reports that he uses illicit drugs (Marijuana).  Allergies:  Allergies  Allergen Reactions  . Bee Venom Anaphylaxis     (Not in a hospital admission)  Results for orders placed during the hospital encounter of 10/26/12 (from the past 48 hour(s))  CBC     Status: Abnormal   Collection Time    10/26/12  2:25 AM      Result Value Range   WBC 13.3 (*) 4.0 - 10.5 K/uL   RBC 4.02 (*) 4.22 - 5.81 MIL/uL   Hemoglobin 12.7 (*) 13.0 - 17.0 g/dL   HCT 16.1 (*) 09.6 - 04.5 %   MCV 89.8  78.0 - 100.0 fL   MCH 31.6  26.0 - 34.0 pg   MCHC 35.2  30.0 - 36.0 g/dL   RDW 40.9  81.1 - 91.4 %   Platelets 228  150 - 400 K/uL  APTT     Status: None   Collection Time    10/26/12  2:25 AM      Result Value Range   aPTT 32  24 - 37 seconds  PROTIME-INR     Status: None   Collection Time    10/26/12  2:25 AM      Result Value Range   Prothrombin Time 14.1  11.6 - 15.2 seconds   INR 1.11  0.00 - 1.49  POCT I-STAT TROPONIN I     Status: None   Collection Time    10/26/12  2:43 AM      Result Value Range   Troponin i, poc 0.00  0.00 - 0.08 ng/mL   Comment 3            Comment: Due to the release kinetics of cTnI,     a negative result within the first hours     of the onset of symptoms does not rule out     myocardial infarction with certainty.     If myocardial infarction is still suspected,     repeat the test at appropriate intervals.   Dg Chest Port 1 View  10/26/2012    *RADIOLOGY REPORT*  Clinical Data: Pericarditis  PORTABLE CHEST - 1 VIEW  Comparison: Prior radiograph from 08/21/2012  Findings: Cardiac and mediastinal silhouettes are stable in size and contour, and remain within normal limits.  Lungs are normally inflated.  No airspace consolidation, pleural effusion, or pulmonary edema is identified.  There is no pneumothorax.  No acute osseous abnormality identified.  IMPRESSION: No radiographic evidence of acute cardiopulmonary disease.   Original Report Authenticated By: Rise Mu, M.D.    ROS Constitutional: Negative for fever and diaphoresis.  HENT: Negative for neck pain.  Eyes: Negative for redness.  Respiratory: Positive for shortness of breath. Negative for cough.  Cardiovascular: Positive for chest pain. Negative for palpitations and leg swelling.  Gastrointestinal: Negative for nausea, vomiting and abdominal pain.  Genitourinary: Negative for dysuria.  Musculoskeletal: Negative for back pain.  Skin: Negative for rash.  Neurological: Negative for syncope and light-headedness.  Blood pressure 109/66, pulse 60, resp. rate 13, SpO2 99.00%. Physical Exam   Constitutional: He appears well-developed and well-nourished.  HENT: Head: Normocephalic and atraumatic. Mucous membranes are normal.  Eyes: Manson Passey, Conj-pink, Sclera-white.  Neck: Trachea normal and normal range of motion. Neck supple. Normal carotid pulses and no JVD present.  Cardiovascular: Normal rate, regular rhythm, S1 normal, S2 normal, Positive friction rub when lying supine. .  Pulmonary/Chest: Effort normal and breath sounds normal.   Abdominal: Soft and bowel sounds are normal. There is no tenderness.  Musculoskeletal: He exhibits no edema, cyanosis or clubbing.  Neurological: He is alert. Moves all 4 extremities. Skin: Skin is warm and dry.  Psychiatric: He has a normal mood and affect.   Assessment/Plan Chest pain of pericarditis EKG with early  repolarization.  Echocardiogram in AM Home medications  Amelia Macken S 10/26/2012, 3:03 AM

## 2012-10-26 NOTE — ED Notes (Signed)
Code STEMI cancelled as pt has pericarditis and his EKG is not a new change.Seen by cardiologist.

## 2012-10-26 NOTE — Progress Notes (Signed)
Chaplain paged to ED for Code Stemi. No family present. Per nursing, cardiologist working on patient. Chaplain received update that Code Stemi cancelled.   10/26/12 0152  Clinical Encounter Type  Visited With Health care provider  Visit Type Initial;ED  Referral From Nurse

## 2012-10-26 NOTE — ED Notes (Signed)
Pt still in pain and wanting dilaudid for pain.Informed dr Urban Gibson.

## 2012-10-26 NOTE — Progress Notes (Signed)
Echocardiogram 2D Echocardiogram has been performed.  Kurt Mcdaniel 10/26/2012, 1:13 PM

## 2012-10-26 NOTE — ED Notes (Signed)
Pt demanding dilaudid known by doctors.

## 2012-11-20 ENCOUNTER — Other Ambulatory Visit: Payer: Self-pay

## 2015-02-06 IMAGING — CR DG CHEST 2V
2 series · 2 of 2 positions shown · non-contrast
Comparison: 12/01/2009

CLINICAL DATA: Chest pain

CHEST - 2 VIEW

[w chest pa]
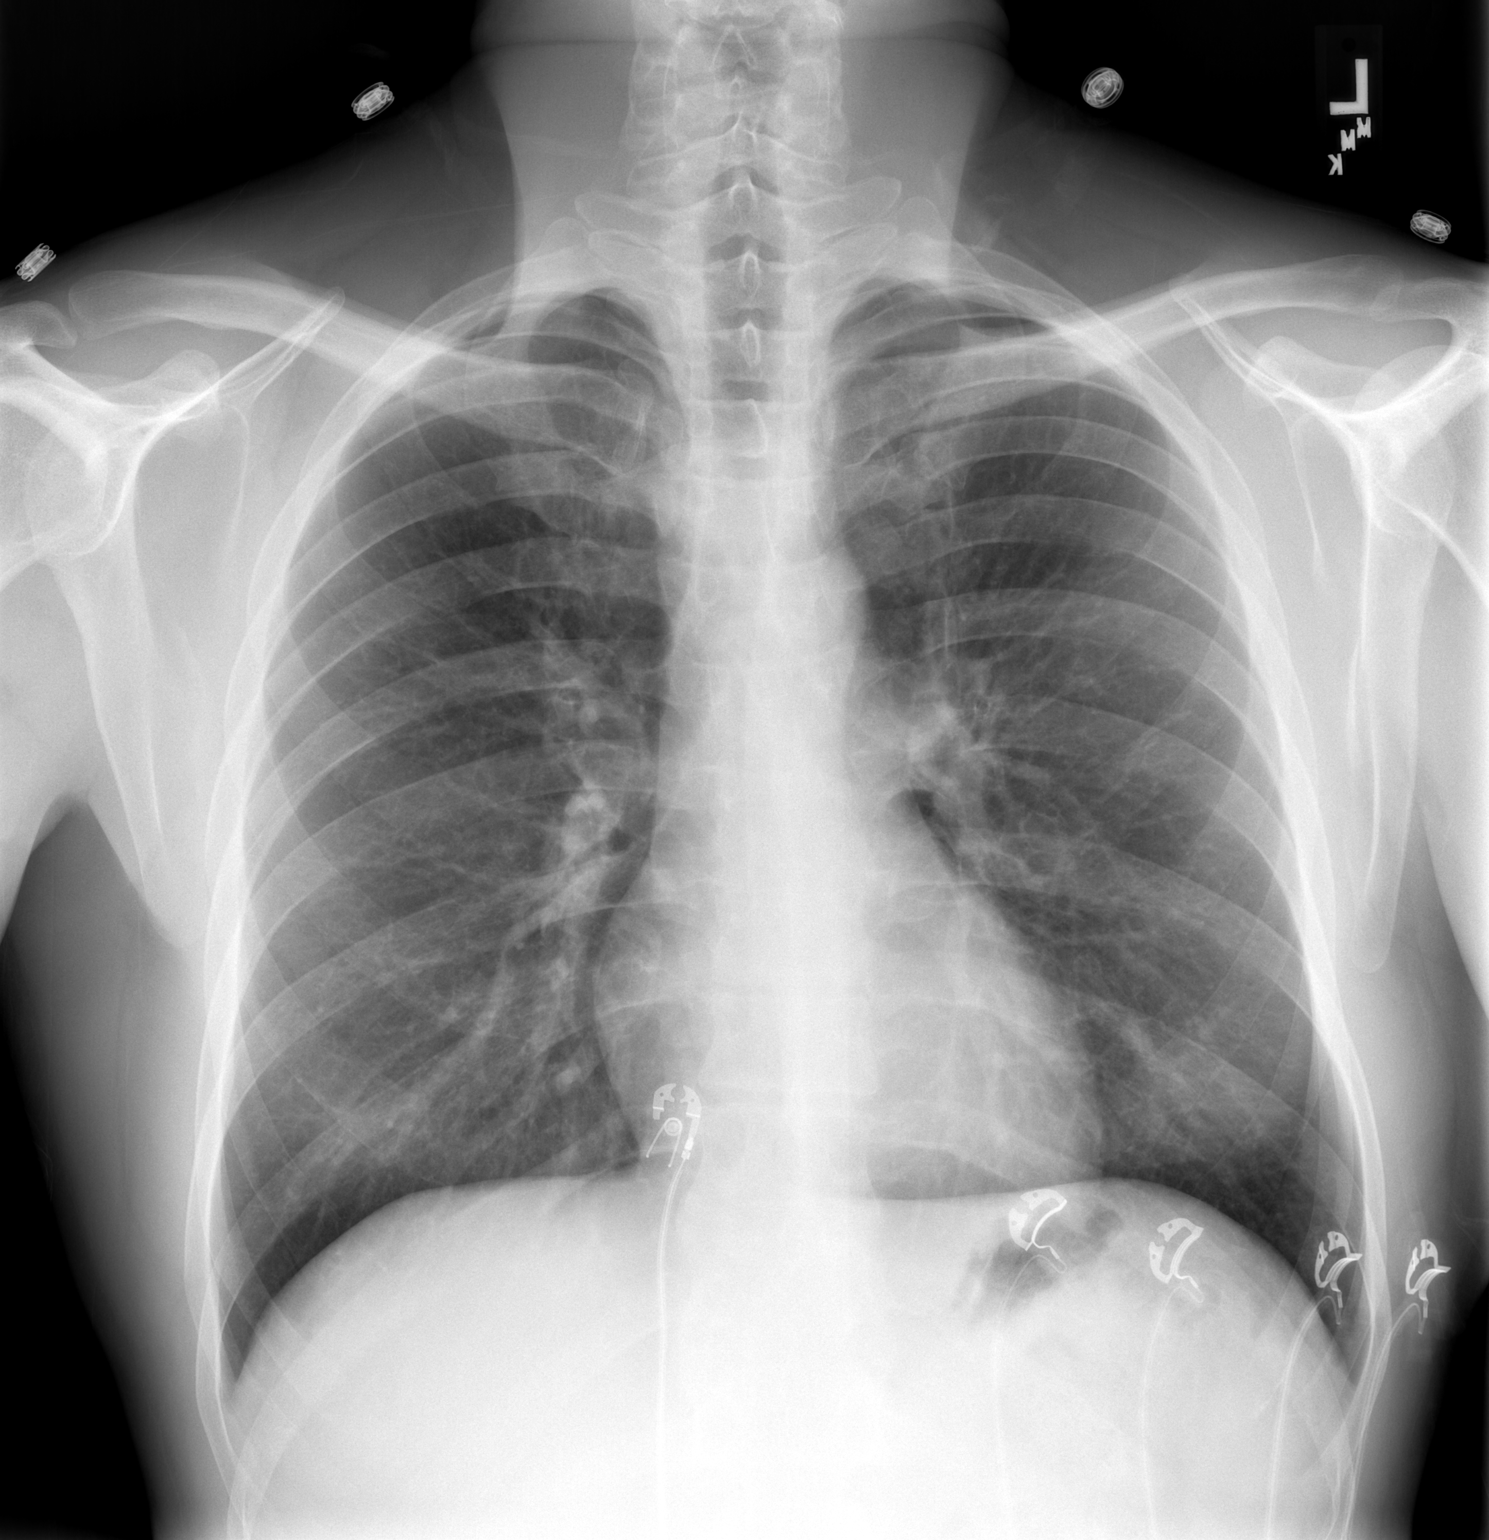

[w chest lat]
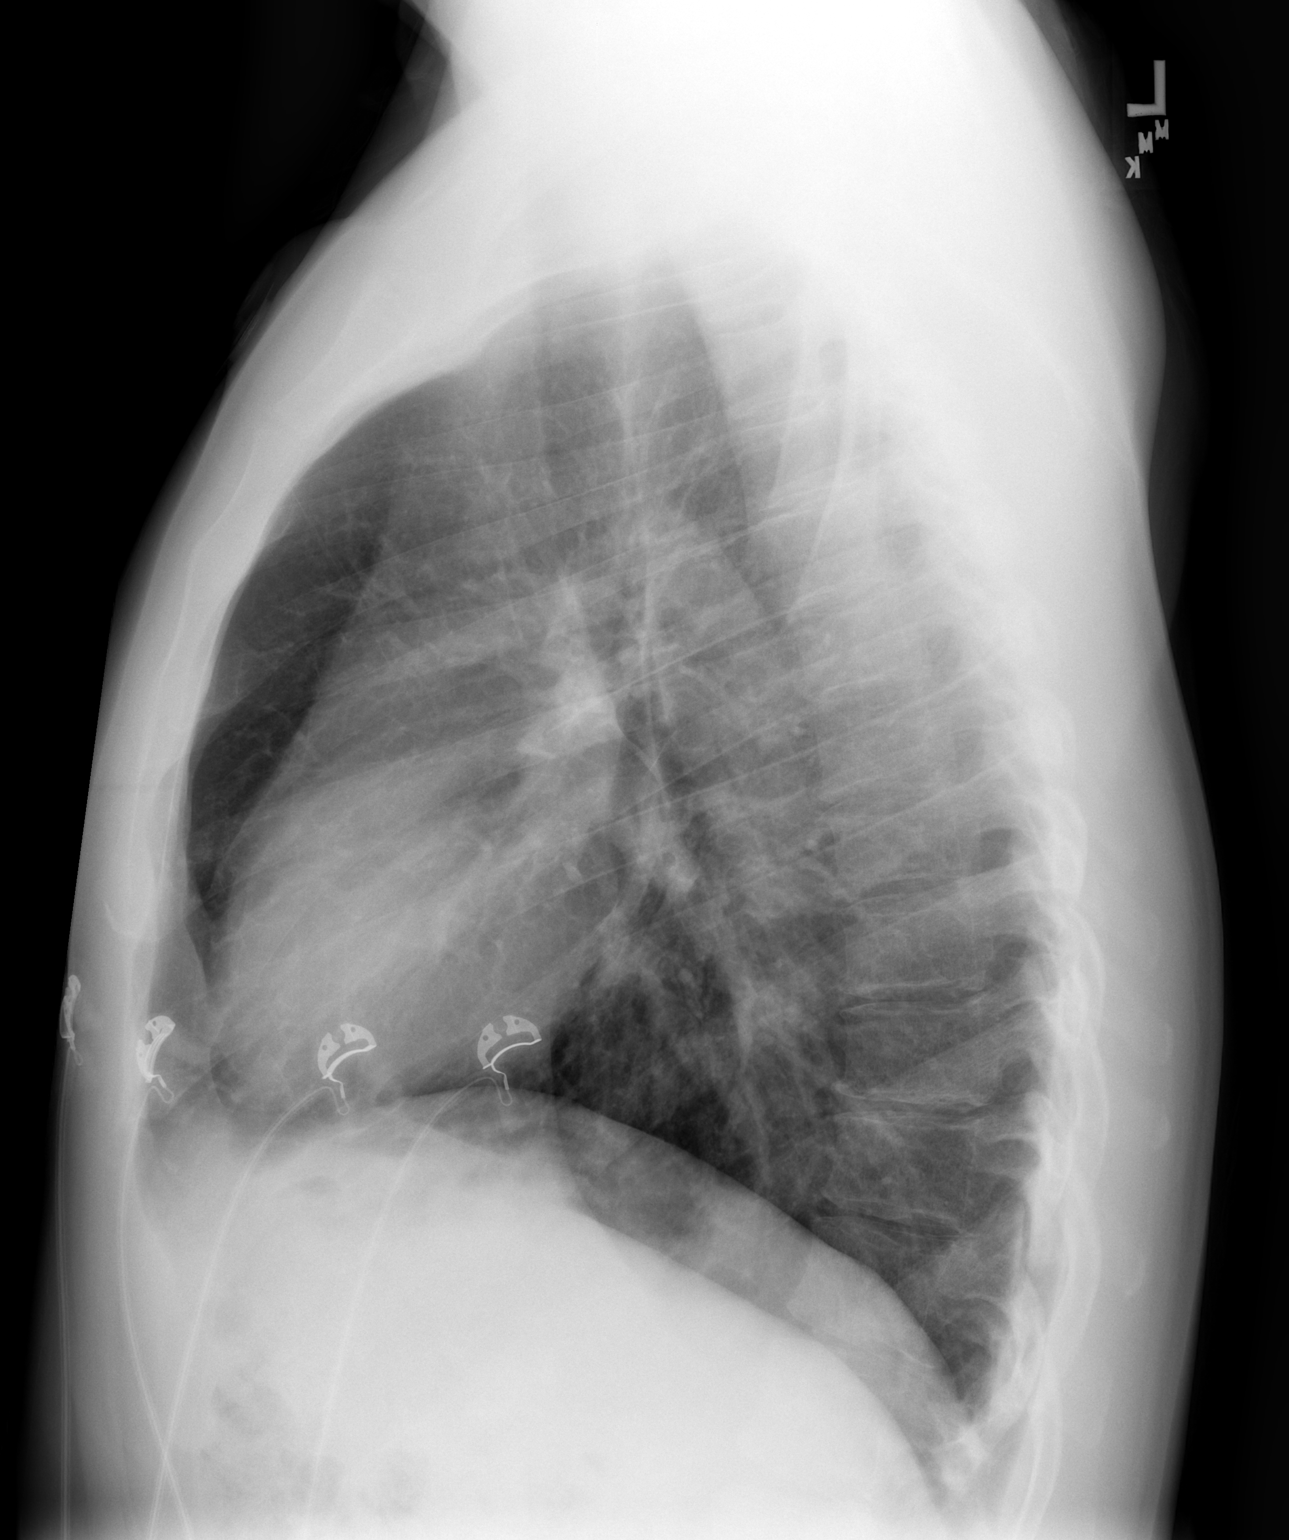

[2 of 2 positions shown; findings below may reference images not displayed]

FINDINGS: Lungs are clear. No pleural effusion or pneumothorax.

Cardiomediastinal silhouette is within normal limits.

Visualized osseous structures are within normal limits.
IMPRESSION: No evidence of acute cardiopulmonary disease.

## 2015-06-18 IMAGING — CR DG CHEST 1V PORT
1 series · 1 of 1 positions shown · non-contrast
Comparison: 04/15/2012

CLINICAL DATA: Chest pain.  Dizziness.  Smoker.

PORTABLE CHEST - 1 VIEW

[AP]
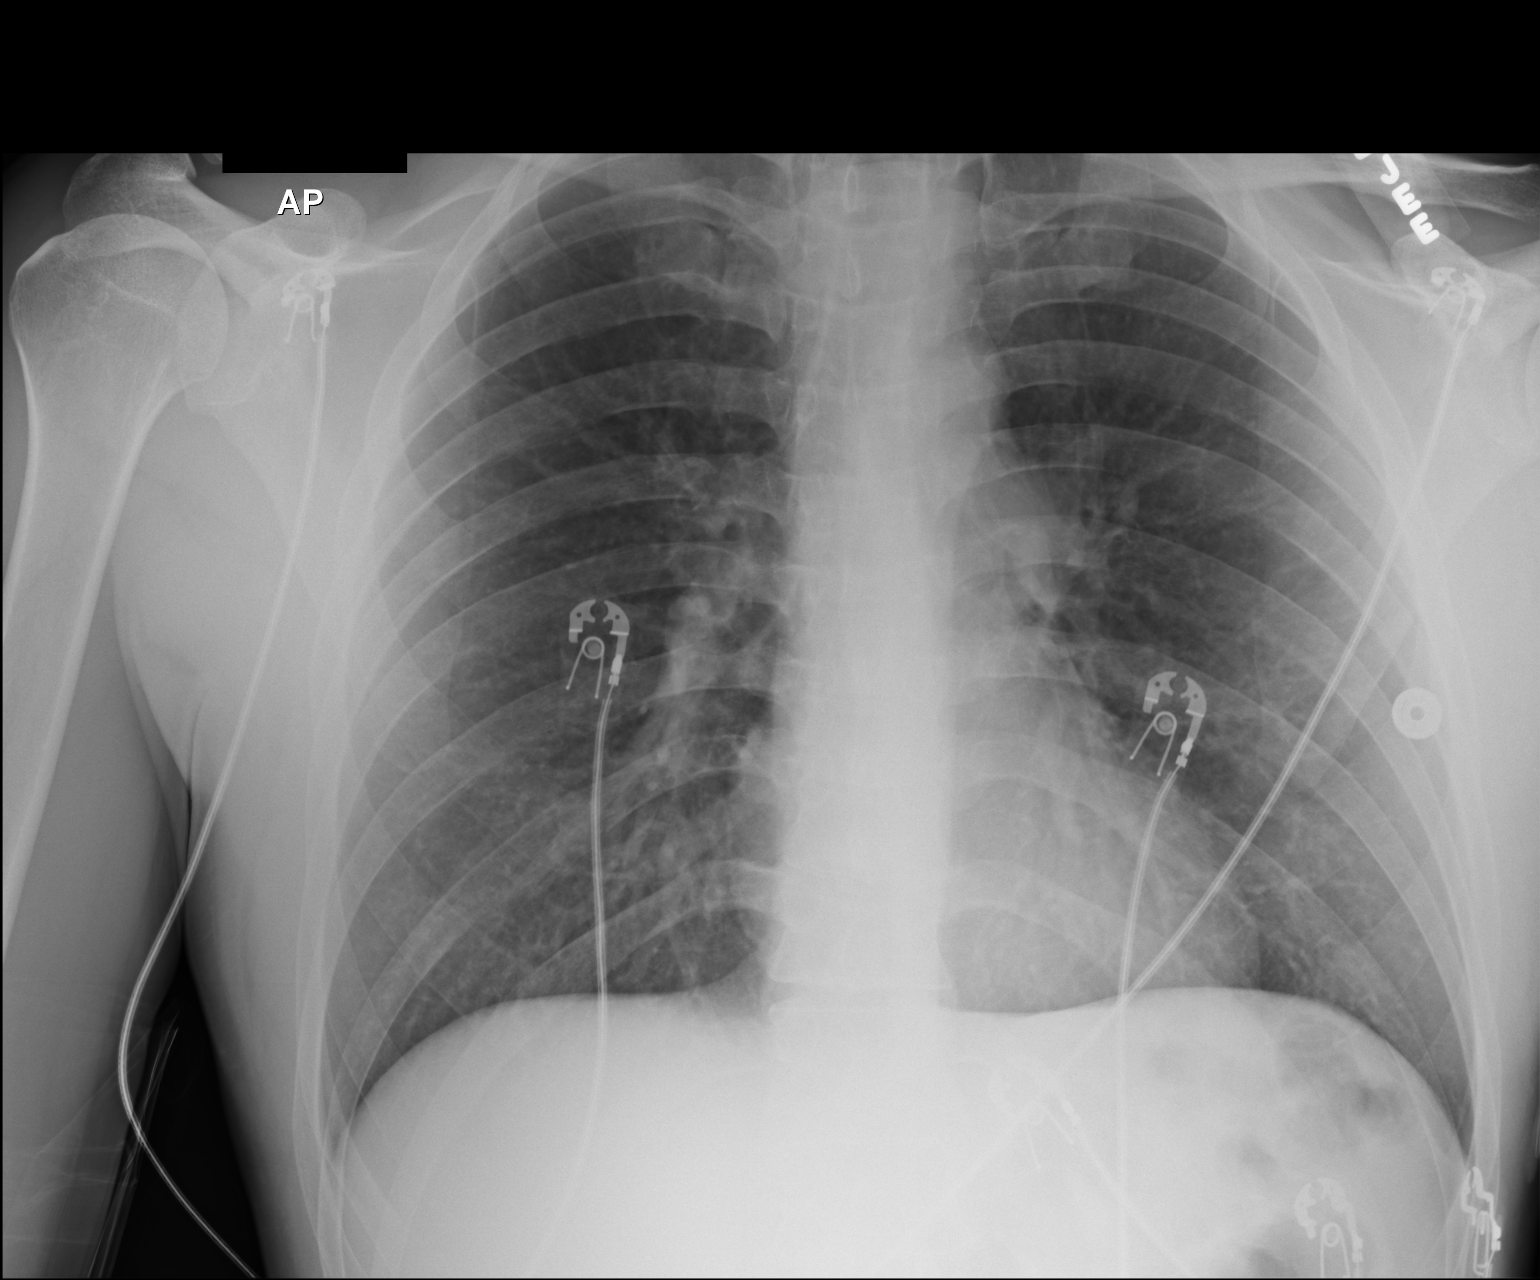

[1 of 1 positions shown; findings below may reference images not displayed]

FINDINGS: Numerous leads and wires project over the chest.  Patient
rotated minimally left. Midline trachea.  Normal heart size and
mediastinal contours. No pleural effusion or pneumothorax.  Clear
lungs.
IMPRESSION: No acute cardiopulmonary disease.

## 2020-06-16 ENCOUNTER — Encounter: Payer: Self-pay | Admitting: Emergency Medicine

## 2020-06-16 ENCOUNTER — Ambulatory Visit
Admission: EM | Admit: 2020-06-16 | Discharge: 2020-06-16 | Disposition: A | Discharge status: Home / Self Care | Payer: Self-pay | Attending: Emergency Medicine | Admitting: Emergency Medicine

## 2020-06-16 ENCOUNTER — Other Ambulatory Visit: Payer: Self-pay

## 2020-06-16 DIAGNOSIS — L237 Allergic contact dermatitis due to plants, except food: Secondary | ICD-10-CM

## 2020-06-16 MED ORDER — ZANFEL EX MISC: CUTANEOUS | 0 refills | Status: AC

## 2020-06-16 MED ORDER — HYDROXYZINE HCL 25 MG PO TABS: 25.0000 mg | ORAL_TABLET | Freq: Four times a day (QID) | ORAL | 0 refills | Status: AC | PRN

## 2020-06-16 MED ORDER — PREDNISONE 10 MG PO TABS: ORAL_TABLET | ORAL | 0 refills | Status: AC

## 2020-06-16 MED ORDER — MUPIROCIN 2 % EX OINT: 1.0000 "application " | TOPICAL_OINTMENT | Freq: Two times a day (BID) | CUTANEOUS | 0 refills | Status: AC

## 2020-06-16 NOTE — ED Triage Notes (Signed)
Pt is present with a rash located on his legs,arms, lower back, and legs. Pt states that the past three days he has been gardening and afterwards he noticed that he started developing a rash.

## 2020-06-16 NOTE — Discharge Instructions (Addendum)
Use TecNu before going out in areas with known poison ivy/oak.  This will help prevent you from getting poison ivy/oak.  If you get a rash, you can use Zanfel or TecNu extreme to deactivate the oil, which will stop the rash from spreading and help with the itching.  Make sure that you decontaminate your garden tools and shoes as well.  Apply the Bactroban ointment on scabbed areas to help prevent infection.  If you were given steroids, make sure you finish all of them.  You may take Claritin, or Zyrtec during the day, and if that does not work, you can take the Atarax. Dissolve 1 packet (or tablet) of Domeboro (aluminum acetate) in 1 pint of luke-warm water. Soak the affected areas with luke-warm Domeboro solution for 5-10 minutes twice daily. You may use apply gauze soaked in the domeboro.  Gently pat dry, Then apply the steriod / antibiotic cream. You may also take oatmeal baths with Aveeno oatmeal (1 cup in half full bathtub) or cornstarch/baking soda (1 cup each in half full bathtub). To prevent the oatmeal from caking in pipes, place it in a tied sock before dropping it into the bathtub.  Go to www.goodrx.com to look up your medications. This will give you a list of where you can find your prescriptions at the most affordable prices. Or ask the pharmacist what the cash price is, or if they have any other discount programs available to help make your medication more affordable. This can be less expensive than what you would pay with insurance.    Here is a list of primary care providers who are taking new patients:  Dr. Elizabeth Sauer [address] [place] [address] [phone]  [facility] [address] [place], [place] [address] [phone]  [facility] [place] [address] [place] [address]  [phone]  [facility] [address] [place], [place] [address] [phone]  [facility] [address] [place]  [phone] [place],  [place] [address]  Here are clinics/ other resources who will see you if you do not have insurance. Some have certain criteria that you must meet. Call them and find out what they are:  [facility]: [address] [place], [place] [address] Phone: [phone] Hours: First and Third Saturdays of each Month, 9 a.m. - 1 p.m.  [facility]: [address], [address] [place], [place] [address] Phone: [phone] Hours: Tuesday, 4 p.m. - 8 p.m. Thursday, 1 p.m. - 8 p.m. Wednesday, 9 a.m. - [facility] [address], [place], [place] [address] Phone: [phone] Pharmacy Phone Number: [phone] Dental Phone Number: [phone] [facility] Insurance Help: [phone]  Dental Hours: Monday - Thursday, 8 a.m. - 6 p.m.  [facility] [address], [place], [place] [address] Phone: [phone] Pharmacy Phone Number: [phone] [facility] Insurance Help: [phone]  [facility] [address] [place] [place], [place] [address] Phone: [phone] Pharmacy Phone Number: [phone] [facility] Insurance Help: [phone]  [facility] [address] [place], [place] [address] Phone: [phone] [facility] Insurance Help: [phone]   [facility] [address], [place], [place] [address] Phone: [phone]

## 2020-06-16 NOTE — ED Provider Notes (Signed)
HPI  SUBJECTIVE:  Kurt Mcdaniel is a 45 y.o. male who presents with intensely pruritic, erythematous, vesicular rash on his hands and arms which has now spread to his inner thighs.  Reports swelling of his forearms and yellowish crusting.  States that his symptoms started after working outside 2 days ago when he pulled a vine out of roses that he thinks was poison ivy or poison oak.  No fevers, purulent drainage.  He tried scratching himself, cold baths and showers.  Scratching makes things worse, cool baths help.  He has clean his close, but has not cleaned his tools or shoes yet.  Past medical history negative for diabetes, MRSA.  PMD: None.    Past Medical History:  Diagnosis Date  . Pericarditis     History reviewed. No pertinent surgical history.  History reviewed. No pertinent family history.  Social History   Tobacco Use  . Smoking status: Current Every Day Smoker  Substance Use Topics  . Alcohol use: Yes  . Drug use: Yes    Types: Marijuana    No current facility-administered medications for this encounter.  Current Outpatient Medications:  .  hydrOXYzine (ATARAX/VISTARIL) 25 MG tablet, Take 1 tablet (25 mg total) by mouth every 6 (six) hours as needed for up to 10 days for itching., Disp: 30 tablet, Rfl: 0 .  mupirocin ointment (BACTROBAN) 2 %, Apply 1 application topically 2 (two) times daily., Disp: 22 g, Rfl: 0 .  Poison Ivy Treatments (ZANFEL) MISC, Apply per directions, Disp: 30 g, Rfl: 0 .  predniSONE (DELTASONE) 10 MG tablet, 6 tabs on day 1-2, 5 tabs on day 3-4, 4 tabs on day 5-6, 3 tabs on day 7-8, 2 tabs day 9-10, 1 tab day 11-12, Disp: 42 tablet, Rfl: 0  Allergies  Allergen Reactions  . Bee Venom Anaphylaxis  . Shellfish Allergy Anaphylaxis     ROS  As noted in HPI.   Physical Exam  BP (!) 122/108 (BP Location: Left Arm)   Pulse 73   Temp 98 F (36.7 C) (Oral)   Resp 18   SpO2 99%   Constitutional: Well developed, well nourished, no acute  distress Eyes:  EOMI, conjunctiva normal bilaterally HENT: Normocephalic, atraumatic,mucus membranes moist Respiratory: Normal inspiratory effort Cardiovascular: Normal rate GI: nondistended skin: erythematous, papular rash with excoriations on bilateral forearms.  Positive increased temperature, mild swelling and tenderness right forearm.  Yellowish crusting in 1 area.  Positive ruptured blister on finger.           Musculoskeletal: no deformities Neurologic: Alert & oriented x 3, no focal neuro deficits Psychiatric: Speech and behavior appropriate   ED Course   Medications - No data to display  No orders of the defined types were placed in this encounter.   No results found for this or any previous visit (from the past 24 hour(s)). No results found.  ED Clinical Impression  1. Poison ivy dermatitis      ED Assessment/Plan  Patient with a contact dermatitis, most likely from poison ivy or poison oak.  Will send home with 12-day prednisone taper, Claritin or Zyrtec, if that does not work, then Atarax.  Bactroban to help prevent secondary infection.  Do not think that he needs oral antibiotics.  Zanfel or Tecnu to decontaminate his tools and shoes.  Will provide primary care list for ongoing care and order assistance in finding a PMD.   Discussed, MDM, treatment plan, and plan for follow-up with patient. patient agrees with  plan.   Meds ordered this encounter  Medications  . hydrOXYzine (ATARAX/VISTARIL) 25 MG tablet    Sig: Take 1 tablet (25 mg total) by mouth every 6 (six) hours as needed for up to 10 days for itching.    Dispense:  30 tablet    Refill:  0  . Poison Ivy Treatments (ZANFEL) MISC    Sig: Apply per directions    Dispense:  30 g    Refill:  0  . predniSONE (DELTASONE) 10 MG tablet    Sig: 6 tabs on day 1-2, 5 tabs on day 3-4, 4 tabs on day 5-6, 3 tabs on day 7-8, 2 tabs day 9-10, 1 tab day 11-12    Dispense:  42 tablet    Refill:  0  . mupirocin  ointment (BACTROBAN) 2 %    Sig: Apply 1 application topically 2 (two) times daily.    Dispense:  22 g    Refill:  0      *This clinic note was created using Scientist, clinical (histocompatibility and immunogenetics). Therefore, there may be occasional mistakes despite careful proofreading.  ?    Domenick Gong, MD 06/18/20 1425
